# Patient Record
Sex: Male | Born: 1940 | Race: White | Hispanic: No | Marital: Married | Smoking: Never smoker
Health system: Southern US, Community
[De-identification: ages and names within clinical notes are randomized; demographics above are authoritative.]

## PROBLEM LIST (undated history)

## (undated) DIAGNOSIS — I499 Cardiac arrhythmia, unspecified: Secondary | ICD-10-CM

## (undated) DIAGNOSIS — T884XXA Failed or difficult intubation, initial encounter: Secondary | ICD-10-CM

## (undated) DIAGNOSIS — G971 Other reaction to spinal and lumbar puncture: Secondary | ICD-10-CM

## (undated) DIAGNOSIS — C801 Malignant (primary) neoplasm, unspecified: Secondary | ICD-10-CM

## (undated) DIAGNOSIS — E7800 Pure hypercholesterolemia, unspecified: Secondary | ICD-10-CM

## (undated) DIAGNOSIS — I639 Cerebral infarction, unspecified: Secondary | ICD-10-CM

## (undated) DIAGNOSIS — E119 Type 2 diabetes mellitus without complications: Secondary | ICD-10-CM

## (undated) DIAGNOSIS — I2510 Atherosclerotic heart disease of native coronary artery without angina pectoris: Secondary | ICD-10-CM

## (undated) DIAGNOSIS — Z87442 Personal history of urinary calculi: Secondary | ICD-10-CM

## (undated) DIAGNOSIS — I4891 Unspecified atrial fibrillation: Secondary | ICD-10-CM

## (undated) DIAGNOSIS — K759 Inflammatory liver disease, unspecified: Secondary | ICD-10-CM

## (undated) DIAGNOSIS — N289 Disorder of kidney and ureter, unspecified: Secondary | ICD-10-CM

## (undated) DIAGNOSIS — I10 Essential (primary) hypertension: Secondary | ICD-10-CM

## (undated) DIAGNOSIS — M1990 Unspecified osteoarthritis, unspecified site: Secondary | ICD-10-CM

## (undated) DIAGNOSIS — R0600 Dyspnea, unspecified: Secondary | ICD-10-CM

## (undated) HISTORY — PX: CHOLECYSTECTOMY: SHX55

## (undated) HISTORY — PX: HERNIA REPAIR: SHX51

## (undated) HISTORY — PX: OTHER SURGICAL HISTORY: SHX169

## (undated) HISTORY — PX: TOTAL KNEE ARTHROPLASTY: SHX125

## (undated) HISTORY — PX: CIRCUMCISION: SUR203

## (undated) HISTORY — PX: ACHILLES TENDON REPAIR: SUR1153

## (undated) HISTORY — PX: LITHOTRIPSY: SUR834

## (undated) HISTORY — PX: CLAVICLE SURGERY: SHX598

---

## 2011-09-03 DIAGNOSIS — E7800 Pure hypercholesterolemia, unspecified: Secondary | ICD-10-CM | POA: Diagnosis not present

## 2011-09-03 DIAGNOSIS — E119 Type 2 diabetes mellitus without complications: Secondary | ICD-10-CM | POA: Diagnosis not present

## 2011-09-03 DIAGNOSIS — I10 Essential (primary) hypertension: Secondary | ICD-10-CM | POA: Diagnosis not present

## 2011-09-09 DIAGNOSIS — E669 Obesity, unspecified: Secondary | ICD-10-CM | POA: Diagnosis not present

## 2011-09-09 DIAGNOSIS — I10 Essential (primary) hypertension: Secondary | ICD-10-CM | POA: Diagnosis not present

## 2011-09-09 DIAGNOSIS — E7800 Pure hypercholesterolemia, unspecified: Secondary | ICD-10-CM | POA: Diagnosis not present

## 2011-09-09 DIAGNOSIS — M1990 Unspecified osteoarthritis, unspecified site: Secondary | ICD-10-CM | POA: Diagnosis not present

## 2011-09-09 DIAGNOSIS — N400 Enlarged prostate without lower urinary tract symptoms: Secondary | ICD-10-CM | POA: Diagnosis not present

## 2011-09-18 DIAGNOSIS — M1710 Unilateral primary osteoarthritis, unspecified knee: Secondary | ICD-10-CM | POA: Diagnosis not present

## 2011-10-01 DIAGNOSIS — M1710 Unilateral primary osteoarthritis, unspecified knee: Secondary | ICD-10-CM | POA: Diagnosis not present

## 2011-10-06 DIAGNOSIS — M1710 Unilateral primary osteoarthritis, unspecified knee: Secondary | ICD-10-CM | POA: Diagnosis not present

## 2011-10-12 DIAGNOSIS — M1710 Unilateral primary osteoarthritis, unspecified knee: Secondary | ICD-10-CM | POA: Diagnosis not present

## 2011-12-01 DIAGNOSIS — H538 Other visual disturbances: Secondary | ICD-10-CM | POA: Diagnosis not present

## 2011-12-01 DIAGNOSIS — E119 Type 2 diabetes mellitus without complications: Secondary | ICD-10-CM | POA: Diagnosis not present

## 2011-12-01 DIAGNOSIS — H2510 Age-related nuclear cataract, unspecified eye: Secondary | ICD-10-CM | POA: Diagnosis not present

## 2011-12-22 DIAGNOSIS — Z87442 Personal history of urinary calculi: Secondary | ICD-10-CM | POA: Diagnosis not present

## 2011-12-22 DIAGNOSIS — I10 Essential (primary) hypertension: Secondary | ICD-10-CM | POA: Diagnosis not present

## 2011-12-22 DIAGNOSIS — E119 Type 2 diabetes mellitus without complications: Secondary | ICD-10-CM | POA: Diagnosis not present

## 2011-12-22 DIAGNOSIS — N1330 Unspecified hydronephrosis: Secondary | ICD-10-CM | POA: Diagnosis not present

## 2011-12-22 DIAGNOSIS — N200 Calculus of kidney: Secondary | ICD-10-CM | POA: Diagnosis not present

## 2011-12-22 DIAGNOSIS — N2889 Other specified disorders of kidney and ureter: Secondary | ICD-10-CM | POA: Diagnosis not present

## 2011-12-25 DIAGNOSIS — N200 Calculus of kidney: Secondary | ICD-10-CM | POA: Diagnosis not present

## 2011-12-25 DIAGNOSIS — N400 Enlarged prostate without lower urinary tract symptoms: Secondary | ICD-10-CM | POA: Diagnosis not present

## 2012-01-04 DIAGNOSIS — IMO0001 Reserved for inherently not codable concepts without codable children: Secondary | ICD-10-CM | POA: Diagnosis not present

## 2012-01-04 DIAGNOSIS — E7800 Pure hypercholesterolemia, unspecified: Secondary | ICD-10-CM | POA: Diagnosis not present

## 2012-01-04 DIAGNOSIS — I10 Essential (primary) hypertension: Secondary | ICD-10-CM | POA: Diagnosis not present

## 2012-01-04 DIAGNOSIS — N400 Enlarged prostate without lower urinary tract symptoms: Secondary | ICD-10-CM | POA: Diagnosis not present

## 2012-01-11 DIAGNOSIS — E669 Obesity, unspecified: Secondary | ICD-10-CM | POA: Diagnosis not present

## 2012-01-11 DIAGNOSIS — E7800 Pure hypercholesterolemia, unspecified: Secondary | ICD-10-CM | POA: Diagnosis not present

## 2012-01-11 DIAGNOSIS — N400 Enlarged prostate without lower urinary tract symptoms: Secondary | ICD-10-CM | POA: Diagnosis not present

## 2012-01-11 DIAGNOSIS — M1990 Unspecified osteoarthritis, unspecified site: Secondary | ICD-10-CM | POA: Diagnosis not present

## 2012-01-11 DIAGNOSIS — I10 Essential (primary) hypertension: Secondary | ICD-10-CM | POA: Diagnosis not present

## 2012-01-13 DIAGNOSIS — N200 Calculus of kidney: Secondary | ICD-10-CM | POA: Diagnosis not present

## 2012-01-21 DIAGNOSIS — I4891 Unspecified atrial fibrillation: Secondary | ICD-10-CM | POA: Diagnosis not present

## 2012-01-21 DIAGNOSIS — N390 Urinary tract infection, site not specified: Secondary | ICD-10-CM | POA: Diagnosis not present

## 2012-01-21 DIAGNOSIS — M79609 Pain in unspecified limb: Secondary | ICD-10-CM | POA: Diagnosis not present

## 2012-01-21 DIAGNOSIS — Z7982 Long term (current) use of aspirin: Secondary | ICD-10-CM | POA: Diagnosis not present

## 2012-01-21 DIAGNOSIS — R9431 Abnormal electrocardiogram [ECG] [EKG]: Secondary | ICD-10-CM | POA: Diagnosis not present

## 2012-01-21 DIAGNOSIS — Z6841 Body Mass Index (BMI) 40.0 and over, adult: Secondary | ICD-10-CM | POA: Diagnosis not present

## 2012-01-21 DIAGNOSIS — N201 Calculus of ureter: Secondary | ICD-10-CM | POA: Diagnosis not present

## 2012-01-21 DIAGNOSIS — Z87891 Personal history of nicotine dependence: Secondary | ICD-10-CM | POA: Diagnosis not present

## 2012-01-21 DIAGNOSIS — N200 Calculus of kidney: Secondary | ICD-10-CM | POA: Diagnosis not present

## 2012-01-21 DIAGNOSIS — Z9089 Acquired absence of other organs: Secondary | ICD-10-CM | POA: Diagnosis not present

## 2012-01-21 DIAGNOSIS — R112 Nausea with vomiting, unspecified: Secondary | ICD-10-CM | POA: Diagnosis not present

## 2012-01-21 DIAGNOSIS — Z853 Personal history of malignant neoplasm of breast: Secondary | ICD-10-CM | POA: Diagnosis not present

## 2012-01-21 DIAGNOSIS — E119 Type 2 diabetes mellitus without complications: Secondary | ICD-10-CM | POA: Diagnosis not present

## 2012-01-21 DIAGNOSIS — N400 Enlarged prostate without lower urinary tract symptoms: Secondary | ICD-10-CM | POA: Diagnosis not present

## 2012-01-21 DIAGNOSIS — R000 Tachycardia, unspecified: Secondary | ICD-10-CM | POA: Diagnosis not present

## 2012-01-21 DIAGNOSIS — N401 Enlarged prostate with lower urinary tract symptoms: Secondary | ICD-10-CM | POA: Diagnosis not present

## 2012-01-21 DIAGNOSIS — N179 Acute kidney failure, unspecified: Secondary | ICD-10-CM | POA: Diagnosis not present

## 2012-01-21 DIAGNOSIS — R079 Chest pain, unspecified: Secondary | ICD-10-CM | POA: Diagnosis not present

## 2012-01-21 DIAGNOSIS — Z9889 Other specified postprocedural states: Secondary | ICD-10-CM | POA: Diagnosis not present

## 2012-01-21 DIAGNOSIS — Z79899 Other long term (current) drug therapy: Secondary | ICD-10-CM | POA: Diagnosis not present

## 2012-01-21 DIAGNOSIS — M25519 Pain in unspecified shoulder: Secondary | ICD-10-CM | POA: Diagnosis not present

## 2012-01-22 DIAGNOSIS — N401 Enlarged prostate with lower urinary tract symptoms: Secondary | ICD-10-CM | POA: Diagnosis present

## 2012-01-22 DIAGNOSIS — G609 Hereditary and idiopathic neuropathy, unspecified: Secondary | ICD-10-CM | POA: Diagnosis present

## 2012-01-22 DIAGNOSIS — E871 Hypo-osmolality and hyponatremia: Secondary | ICD-10-CM | POA: Diagnosis present

## 2012-01-22 DIAGNOSIS — M75120 Complete rotator cuff tear or rupture of unspecified shoulder, not specified as traumatic: Secondary | ICD-10-CM | POA: Diagnosis not present

## 2012-01-22 DIAGNOSIS — N390 Urinary tract infection, site not specified: Secondary | ICD-10-CM | POA: Diagnosis not present

## 2012-01-22 DIAGNOSIS — R578 Other shock: Secondary | ICD-10-CM | POA: Diagnosis present

## 2012-01-22 DIAGNOSIS — S43429A Sprain of unspecified rotator cuff capsule, initial encounter: Secondary | ICD-10-CM | POA: Diagnosis present

## 2012-01-22 DIAGNOSIS — D696 Thrombocytopenia, unspecified: Secondary | ICD-10-CM | POA: Diagnosis present

## 2012-01-22 DIAGNOSIS — N200 Calculus of kidney: Secondary | ICD-10-CM | POA: Diagnosis present

## 2012-01-22 DIAGNOSIS — M25519 Pain in unspecified shoulder: Secondary | ICD-10-CM | POA: Diagnosis not present

## 2012-01-22 DIAGNOSIS — N179 Acute kidney failure, unspecified: Secondary | ICD-10-CM | POA: Diagnosis not present

## 2012-01-22 DIAGNOSIS — E119 Type 2 diabetes mellitus without complications: Secondary | ICD-10-CM | POA: Diagnosis present

## 2012-01-22 DIAGNOSIS — N189 Chronic kidney disease, unspecified: Secondary | ICD-10-CM | POA: Diagnosis present

## 2012-01-22 DIAGNOSIS — D649 Anemia, unspecified: Secondary | ICD-10-CM | POA: Diagnosis present

## 2012-01-22 DIAGNOSIS — I129 Hypertensive chronic kidney disease with stage 1 through stage 4 chronic kidney disease, or unspecified chronic kidney disease: Secondary | ICD-10-CM | POA: Diagnosis present

## 2012-01-22 DIAGNOSIS — I4891 Unspecified atrial fibrillation: Secondary | ICD-10-CM | POA: Diagnosis present

## 2012-02-11 DIAGNOSIS — R0602 Shortness of breath: Secondary | ICD-10-CM | POA: Diagnosis not present

## 2012-02-11 DIAGNOSIS — I4891 Unspecified atrial fibrillation: Secondary | ICD-10-CM | POA: Diagnosis not present

## 2012-02-11 DIAGNOSIS — N200 Calculus of kidney: Secondary | ICD-10-CM | POA: Diagnosis not present

## 2012-02-29 DIAGNOSIS — Z8619 Personal history of other infectious and parasitic diseases: Secondary | ICD-10-CM | POA: Diagnosis not present

## 2012-02-29 DIAGNOSIS — N201 Calculus of ureter: Secondary | ICD-10-CM | POA: Diagnosis not present

## 2012-02-29 DIAGNOSIS — E119 Type 2 diabetes mellitus without complications: Secondary | ICD-10-CM | POA: Diagnosis not present

## 2012-02-29 DIAGNOSIS — N209 Urinary calculus, unspecified: Secondary | ICD-10-CM | POA: Diagnosis not present

## 2012-03-08 DIAGNOSIS — M25519 Pain in unspecified shoulder: Secondary | ICD-10-CM | POA: Diagnosis not present

## 2012-03-16 DIAGNOSIS — N200 Calculus of kidney: Secondary | ICD-10-CM | POA: Diagnosis not present

## 2012-04-15 DIAGNOSIS — N200 Calculus of kidney: Secondary | ICD-10-CM | POA: Diagnosis not present

## 2012-04-29 DIAGNOSIS — M1710 Unilateral primary osteoarthritis, unspecified knee: Secondary | ICD-10-CM | POA: Diagnosis not present

## 2012-05-02 DIAGNOSIS — E782 Mixed hyperlipidemia: Secondary | ICD-10-CM | POA: Diagnosis not present

## 2012-05-02 DIAGNOSIS — I10 Essential (primary) hypertension: Secondary | ICD-10-CM | POA: Diagnosis not present

## 2012-05-05 DIAGNOSIS — M1710 Unilateral primary osteoarthritis, unspecified knee: Secondary | ICD-10-CM | POA: Diagnosis not present

## 2012-05-09 DIAGNOSIS — Z23 Encounter for immunization: Secondary | ICD-10-CM | POA: Diagnosis not present

## 2012-05-09 DIAGNOSIS — N400 Enlarged prostate without lower urinary tract symptoms: Secondary | ICD-10-CM | POA: Diagnosis not present

## 2012-05-09 DIAGNOSIS — M1990 Unspecified osteoarthritis, unspecified site: Secondary | ICD-10-CM | POA: Diagnosis not present

## 2012-05-09 DIAGNOSIS — E669 Obesity, unspecified: Secondary | ICD-10-CM | POA: Diagnosis not present

## 2012-05-09 DIAGNOSIS — E7800 Pure hypercholesterolemia, unspecified: Secondary | ICD-10-CM | POA: Diagnosis not present

## 2012-05-09 DIAGNOSIS — I4891 Unspecified atrial fibrillation: Secondary | ICD-10-CM | POA: Diagnosis not present

## 2012-05-09 DIAGNOSIS — E119 Type 2 diabetes mellitus without complications: Secondary | ICD-10-CM | POA: Diagnosis not present

## 2012-05-09 DIAGNOSIS — I10 Essential (primary) hypertension: Secondary | ICD-10-CM | POA: Diagnosis not present

## 2012-05-12 DIAGNOSIS — N200 Calculus of kidney: Secondary | ICD-10-CM | POA: Diagnosis not present

## 2012-05-12 DIAGNOSIS — R0602 Shortness of breath: Secondary | ICD-10-CM | POA: Diagnosis not present

## 2012-05-12 DIAGNOSIS — I4891 Unspecified atrial fibrillation: Secondary | ICD-10-CM | POA: Diagnosis not present

## 2012-05-13 DIAGNOSIS — M1710 Unilateral primary osteoarthritis, unspecified knee: Secondary | ICD-10-CM | POA: Diagnosis not present

## 2012-05-19 DIAGNOSIS — I4891 Unspecified atrial fibrillation: Secondary | ICD-10-CM | POA: Diagnosis not present

## 2012-09-06 DIAGNOSIS — I10 Essential (primary) hypertension: Secondary | ICD-10-CM | POA: Diagnosis not present

## 2012-09-06 DIAGNOSIS — E7800 Pure hypercholesterolemia, unspecified: Secondary | ICD-10-CM | POA: Diagnosis not present

## 2012-09-06 DIAGNOSIS — E119 Type 2 diabetes mellitus without complications: Secondary | ICD-10-CM | POA: Diagnosis not present

## 2012-09-29 DIAGNOSIS — I10 Essential (primary) hypertension: Secondary | ICD-10-CM | POA: Diagnosis not present

## 2012-09-29 DIAGNOSIS — N400 Enlarged prostate without lower urinary tract symptoms: Secondary | ICD-10-CM | POA: Diagnosis not present

## 2012-09-29 DIAGNOSIS — E669 Obesity, unspecified: Secondary | ICD-10-CM | POA: Diagnosis not present

## 2012-09-29 DIAGNOSIS — E7800 Pure hypercholesterolemia, unspecified: Secondary | ICD-10-CM | POA: Diagnosis not present

## 2012-09-29 DIAGNOSIS — I4891 Unspecified atrial fibrillation: Secondary | ICD-10-CM | POA: Diagnosis not present

## 2012-09-29 DIAGNOSIS — M1990 Unspecified osteoarthritis, unspecified site: Secondary | ICD-10-CM | POA: Diagnosis not present

## 2012-10-14 DIAGNOSIS — R351 Nocturia: Secondary | ICD-10-CM | POA: Diagnosis not present

## 2012-10-14 DIAGNOSIS — N200 Calculus of kidney: Secondary | ICD-10-CM | POA: Diagnosis not present

## 2012-11-15 DIAGNOSIS — M1710 Unilateral primary osteoarthritis, unspecified knee: Secondary | ICD-10-CM | POA: Diagnosis not present

## 2012-11-22 DIAGNOSIS — M1710 Unilateral primary osteoarthritis, unspecified knee: Secondary | ICD-10-CM | POA: Diagnosis not present

## 2012-11-28 DIAGNOSIS — M1710 Unilateral primary osteoarthritis, unspecified knee: Secondary | ICD-10-CM | POA: Diagnosis not present

## 2013-01-17 DIAGNOSIS — I10 Essential (primary) hypertension: Secondary | ICD-10-CM | POA: Diagnosis not present

## 2013-01-17 DIAGNOSIS — E119 Type 2 diabetes mellitus without complications: Secondary | ICD-10-CM | POA: Diagnosis not present

## 2013-01-17 DIAGNOSIS — E7800 Pure hypercholesterolemia, unspecified: Secondary | ICD-10-CM | POA: Diagnosis not present

## 2013-01-30 DIAGNOSIS — E669 Obesity, unspecified: Secondary | ICD-10-CM | POA: Diagnosis not present

## 2013-01-30 DIAGNOSIS — I4891 Unspecified atrial fibrillation: Secondary | ICD-10-CM | POA: Diagnosis not present

## 2013-01-30 DIAGNOSIS — N400 Enlarged prostate without lower urinary tract symptoms: Secondary | ICD-10-CM | POA: Diagnosis not present

## 2013-01-30 DIAGNOSIS — E7800 Pure hypercholesterolemia, unspecified: Secondary | ICD-10-CM | POA: Diagnosis not present

## 2013-01-30 DIAGNOSIS — I10 Essential (primary) hypertension: Secondary | ICD-10-CM | POA: Diagnosis not present

## 2013-01-30 DIAGNOSIS — M1990 Unspecified osteoarthritis, unspecified site: Secondary | ICD-10-CM | POA: Diagnosis not present

## 2013-03-02 DIAGNOSIS — D126 Benign neoplasm of colon, unspecified: Secondary | ICD-10-CM | POA: Diagnosis not present

## 2013-03-02 DIAGNOSIS — Z800 Family history of malignant neoplasm of digestive organs: Secondary | ICD-10-CM | POA: Diagnosis not present

## 2013-04-07 DIAGNOSIS — Z79899 Other long term (current) drug therapy: Secondary | ICD-10-CM | POA: Diagnosis not present

## 2013-04-07 DIAGNOSIS — E785 Hyperlipidemia, unspecified: Secondary | ICD-10-CM | POA: Diagnosis not present

## 2013-04-07 DIAGNOSIS — E119 Type 2 diabetes mellitus without complications: Secondary | ICD-10-CM | POA: Diagnosis not present

## 2013-04-07 DIAGNOSIS — Z800 Family history of malignant neoplasm of digestive organs: Secondary | ICD-10-CM | POA: Diagnosis not present

## 2013-04-07 DIAGNOSIS — D126 Benign neoplasm of colon, unspecified: Secondary | ICD-10-CM | POA: Diagnosis not present

## 2013-04-07 DIAGNOSIS — I10 Essential (primary) hypertension: Secondary | ICD-10-CM | POA: Diagnosis not present

## 2013-04-07 DIAGNOSIS — K648 Other hemorrhoids: Secondary | ICD-10-CM | POA: Diagnosis not present

## 2013-04-07 DIAGNOSIS — Z1211 Encounter for screening for malignant neoplasm of colon: Secondary | ICD-10-CM | POA: Diagnosis not present

## 2013-04-07 DIAGNOSIS — Z91013 Allergy to seafood: Secondary | ICD-10-CM | POA: Diagnosis not present

## 2013-04-07 DIAGNOSIS — Z7982 Long term (current) use of aspirin: Secondary | ICD-10-CM | POA: Diagnosis not present

## 2013-04-07 DIAGNOSIS — D128 Benign neoplasm of rectum: Secondary | ICD-10-CM | POA: Diagnosis not present

## 2013-04-07 DIAGNOSIS — D371 Neoplasm of uncertain behavior of stomach: Secondary | ICD-10-CM | POA: Diagnosis not present

## 2013-04-07 DIAGNOSIS — Z86010 Personal history of colonic polyps: Secondary | ICD-10-CM | POA: Diagnosis not present

## 2013-04-18 DIAGNOSIS — N476 Balanoposthitis: Secondary | ICD-10-CM | POA: Diagnosis not present

## 2013-04-18 DIAGNOSIS — N200 Calculus of kidney: Secondary | ICD-10-CM | POA: Diagnosis not present

## 2013-04-20 DIAGNOSIS — Z800 Family history of malignant neoplasm of digestive organs: Secondary | ICD-10-CM | POA: Diagnosis not present

## 2013-04-20 DIAGNOSIS — K624 Stenosis of anus and rectum: Secondary | ICD-10-CM | POA: Diagnosis not present

## 2013-04-20 DIAGNOSIS — D126 Benign neoplasm of colon, unspecified: Secondary | ICD-10-CM | POA: Diagnosis not present

## 2013-05-25 DIAGNOSIS — I10 Essential (primary) hypertension: Secondary | ICD-10-CM | POA: Diagnosis not present

## 2013-05-25 DIAGNOSIS — E119 Type 2 diabetes mellitus without complications: Secondary | ICD-10-CM | POA: Diagnosis not present

## 2013-05-25 DIAGNOSIS — E669 Obesity, unspecified: Secondary | ICD-10-CM | POA: Diagnosis not present

## 2013-05-25 DIAGNOSIS — E7800 Pure hypercholesterolemia, unspecified: Secondary | ICD-10-CM | POA: Diagnosis not present

## 2013-05-25 DIAGNOSIS — I4891 Unspecified atrial fibrillation: Secondary | ICD-10-CM | POA: Diagnosis not present

## 2013-06-05 DIAGNOSIS — E669 Obesity, unspecified: Secondary | ICD-10-CM | POA: Diagnosis not present

## 2013-06-05 DIAGNOSIS — M1990 Unspecified osteoarthritis, unspecified site: Secondary | ICD-10-CM | POA: Diagnosis not present

## 2013-06-05 DIAGNOSIS — E7800 Pure hypercholesterolemia, unspecified: Secondary | ICD-10-CM | POA: Diagnosis not present

## 2013-06-05 DIAGNOSIS — I10 Essential (primary) hypertension: Secondary | ICD-10-CM | POA: Diagnosis not present

## 2013-06-05 DIAGNOSIS — I4891 Unspecified atrial fibrillation: Secondary | ICD-10-CM | POA: Diagnosis not present

## 2013-06-05 DIAGNOSIS — N400 Enlarged prostate without lower urinary tract symptoms: Secondary | ICD-10-CM | POA: Diagnosis not present

## 2013-06-05 DIAGNOSIS — Z23 Encounter for immunization: Secondary | ICD-10-CM | POA: Diagnosis not present

## 2013-09-13 DIAGNOSIS — IMO0002 Reserved for concepts with insufficient information to code with codable children: Secondary | ICD-10-CM | POA: Diagnosis not present

## 2013-09-13 DIAGNOSIS — M1710 Unilateral primary osteoarthritis, unspecified knee: Secondary | ICD-10-CM | POA: Diagnosis not present

## 2013-09-13 DIAGNOSIS — M25569 Pain in unspecified knee: Secondary | ICD-10-CM | POA: Diagnosis not present

## 2013-09-13 DIAGNOSIS — M25469 Effusion, unspecified knee: Secondary | ICD-10-CM | POA: Diagnosis not present

## 2013-09-13 DIAGNOSIS — M2340 Loose body in knee, unspecified knee: Secondary | ICD-10-CM | POA: Diagnosis not present

## 2013-09-27 DIAGNOSIS — E119 Type 2 diabetes mellitus without complications: Secondary | ICD-10-CM | POA: Diagnosis not present

## 2013-09-27 DIAGNOSIS — I10 Essential (primary) hypertension: Secondary | ICD-10-CM | POA: Diagnosis not present

## 2013-09-27 DIAGNOSIS — I4891 Unspecified atrial fibrillation: Secondary | ICD-10-CM | POA: Diagnosis not present

## 2013-09-27 DIAGNOSIS — E7800 Pure hypercholesterolemia, unspecified: Secondary | ICD-10-CM | POA: Diagnosis not present

## 2013-10-04 DIAGNOSIS — I4891 Unspecified atrial fibrillation: Secondary | ICD-10-CM | POA: Diagnosis not present

## 2013-10-04 DIAGNOSIS — N400 Enlarged prostate without lower urinary tract symptoms: Secondary | ICD-10-CM | POA: Diagnosis not present

## 2013-10-04 DIAGNOSIS — M1990 Unspecified osteoarthritis, unspecified site: Secondary | ICD-10-CM | POA: Diagnosis not present

## 2013-10-04 DIAGNOSIS — E669 Obesity, unspecified: Secondary | ICD-10-CM | POA: Diagnosis not present

## 2013-10-04 DIAGNOSIS — E7800 Pure hypercholesterolemia, unspecified: Secondary | ICD-10-CM | POA: Diagnosis not present

## 2013-10-04 DIAGNOSIS — IMO0001 Reserved for inherently not codable concepts without codable children: Secondary | ICD-10-CM | POA: Diagnosis not present

## 2013-10-04 DIAGNOSIS — I10 Essential (primary) hypertension: Secondary | ICD-10-CM | POA: Diagnosis not present

## 2013-10-24 DIAGNOSIS — M1710 Unilateral primary osteoarthritis, unspecified knee: Secondary | ICD-10-CM | POA: Diagnosis not present

## 2013-10-26 DIAGNOSIS — N200 Calculus of kidney: Secondary | ICD-10-CM | POA: Diagnosis not present

## 2013-10-26 DIAGNOSIS — N401 Enlarged prostate with lower urinary tract symptoms: Secondary | ICD-10-CM | POA: Diagnosis not present

## 2013-10-26 DIAGNOSIS — N476 Balanoposthitis: Secondary | ICD-10-CM | POA: Diagnosis not present

## 2013-10-31 DIAGNOSIS — M1710 Unilateral primary osteoarthritis, unspecified knee: Secondary | ICD-10-CM | POA: Diagnosis not present

## 2013-11-07 DIAGNOSIS — M1710 Unilateral primary osteoarthritis, unspecified knee: Secondary | ICD-10-CM | POA: Diagnosis not present

## 2014-02-07 DIAGNOSIS — I10 Essential (primary) hypertension: Secondary | ICD-10-CM | POA: Diagnosis not present

## 2014-02-07 DIAGNOSIS — E119 Type 2 diabetes mellitus without complications: Secondary | ICD-10-CM | POA: Diagnosis not present

## 2014-02-07 DIAGNOSIS — E7800 Pure hypercholesterolemia, unspecified: Secondary | ICD-10-CM | POA: Diagnosis not present

## 2014-02-07 DIAGNOSIS — E669 Obesity, unspecified: Secondary | ICD-10-CM | POA: Diagnosis not present

## 2014-02-14 DIAGNOSIS — Z23 Encounter for immunization: Secondary | ICD-10-CM | POA: Diagnosis not present

## 2014-02-14 DIAGNOSIS — I4891 Unspecified atrial fibrillation: Secondary | ICD-10-CM | POA: Diagnosis not present

## 2014-02-14 DIAGNOSIS — I10 Essential (primary) hypertension: Secondary | ICD-10-CM | POA: Diagnosis not present

## 2014-02-14 DIAGNOSIS — N400 Enlarged prostate without lower urinary tract symptoms: Secondary | ICD-10-CM | POA: Diagnosis not present

## 2014-02-14 DIAGNOSIS — M1990 Unspecified osteoarthritis, unspecified site: Secondary | ICD-10-CM | POA: Diagnosis not present

## 2014-02-14 DIAGNOSIS — IMO0001 Reserved for inherently not codable concepts without codable children: Secondary | ICD-10-CM | POA: Diagnosis not present

## 2014-02-14 DIAGNOSIS — E669 Obesity, unspecified: Secondary | ICD-10-CM | POA: Diagnosis not present

## 2014-02-14 DIAGNOSIS — E7800 Pure hypercholesterolemia, unspecified: Secondary | ICD-10-CM | POA: Diagnosis not present

## 2014-06-07 DIAGNOSIS — E119 Type 2 diabetes mellitus without complications: Secondary | ICD-10-CM | POA: Diagnosis not present

## 2014-06-07 DIAGNOSIS — I482 Chronic atrial fibrillation: Secondary | ICD-10-CM | POA: Diagnosis not present

## 2014-06-07 DIAGNOSIS — I10 Essential (primary) hypertension: Secondary | ICD-10-CM | POA: Diagnosis not present

## 2014-06-07 DIAGNOSIS — E780 Pure hypercholesterolemia: Secondary | ICD-10-CM | POA: Diagnosis not present

## 2014-06-14 DIAGNOSIS — I10 Essential (primary) hypertension: Secondary | ICD-10-CM | POA: Diagnosis not present

## 2014-06-14 DIAGNOSIS — I482 Chronic atrial fibrillation: Secondary | ICD-10-CM | POA: Diagnosis not present

## 2014-06-14 DIAGNOSIS — Z23 Encounter for immunization: Secondary | ICD-10-CM | POA: Diagnosis not present

## 2014-06-14 DIAGNOSIS — E1365 Other specified diabetes mellitus with hyperglycemia: Secondary | ICD-10-CM | POA: Diagnosis not present

## 2014-06-14 DIAGNOSIS — E782 Mixed hyperlipidemia: Secondary | ICD-10-CM | POA: Diagnosis not present

## 2014-08-13 DIAGNOSIS — L609 Nail disorder, unspecified: Secondary | ICD-10-CM | POA: Diagnosis not present

## 2014-08-13 DIAGNOSIS — L110 Acquired keratosis follicularis: Secondary | ICD-10-CM | POA: Diagnosis not present

## 2014-08-13 DIAGNOSIS — E1140 Type 2 diabetes mellitus with diabetic neuropathy, unspecified: Secondary | ICD-10-CM | POA: Diagnosis not present

## 2014-08-28 DIAGNOSIS — M170 Bilateral primary osteoarthritis of knee: Secondary | ICD-10-CM | POA: Diagnosis not present

## 2014-08-29 DIAGNOSIS — H538 Other visual disturbances: Secondary | ICD-10-CM | POA: Diagnosis not present

## 2014-08-29 DIAGNOSIS — E119 Type 2 diabetes mellitus without complications: Secondary | ICD-10-CM | POA: Diagnosis not present

## 2014-08-29 DIAGNOSIS — H2513 Age-related nuclear cataract, bilateral: Secondary | ICD-10-CM | POA: Diagnosis not present

## 2014-09-04 DIAGNOSIS — M170 Bilateral primary osteoarthritis of knee: Secondary | ICD-10-CM | POA: Diagnosis not present

## 2014-09-11 DIAGNOSIS — M170 Bilateral primary osteoarthritis of knee: Secondary | ICD-10-CM | POA: Diagnosis not present

## 2014-10-02 DIAGNOSIS — E1365 Other specified diabetes mellitus with hyperglycemia: Secondary | ICD-10-CM | POA: Diagnosis not present

## 2014-10-02 DIAGNOSIS — E780 Pure hypercholesterolemia: Secondary | ICD-10-CM | POA: Diagnosis not present

## 2014-10-02 DIAGNOSIS — I10 Essential (primary) hypertension: Secondary | ICD-10-CM | POA: Diagnosis not present

## 2014-10-09 DIAGNOSIS — E1365 Other specified diabetes mellitus with hyperglycemia: Secondary | ICD-10-CM | POA: Diagnosis not present

## 2014-10-09 DIAGNOSIS — I10 Essential (primary) hypertension: Secondary | ICD-10-CM | POA: Diagnosis not present

## 2014-10-09 DIAGNOSIS — Z1389 Encounter for screening for other disorder: Secondary | ICD-10-CM | POA: Diagnosis not present

## 2014-10-09 DIAGNOSIS — I480 Paroxysmal atrial fibrillation: Secondary | ICD-10-CM | POA: Diagnosis not present

## 2014-10-09 DIAGNOSIS — E782 Mixed hyperlipidemia: Secondary | ICD-10-CM | POA: Diagnosis not present

## 2014-10-22 DIAGNOSIS — E1151 Type 2 diabetes mellitus with diabetic peripheral angiopathy without gangrene: Secondary | ICD-10-CM | POA: Diagnosis not present

## 2014-10-22 DIAGNOSIS — E1140 Type 2 diabetes mellitus with diabetic neuropathy, unspecified: Secondary | ICD-10-CM | POA: Diagnosis not present

## 2014-11-15 DIAGNOSIS — N2889 Other specified disorders of kidney and ureter: Secondary | ICD-10-CM | POA: Diagnosis not present

## 2014-11-15 DIAGNOSIS — N200 Calculus of kidney: Secondary | ICD-10-CM | POA: Diagnosis not present

## 2014-11-15 DIAGNOSIS — N401 Enlarged prostate with lower urinary tract symptoms: Secondary | ICD-10-CM | POA: Diagnosis not present

## 2015-01-01 DIAGNOSIS — E1140 Type 2 diabetes mellitus with diabetic neuropathy, unspecified: Secondary | ICD-10-CM | POA: Diagnosis not present

## 2015-01-01 DIAGNOSIS — E1151 Type 2 diabetes mellitus with diabetic peripheral angiopathy without gangrene: Secondary | ICD-10-CM | POA: Diagnosis not present

## 2015-02-05 DIAGNOSIS — E1365 Other specified diabetes mellitus with hyperglycemia: Secondary | ICD-10-CM | POA: Diagnosis not present

## 2015-02-05 DIAGNOSIS — E782 Mixed hyperlipidemia: Secondary | ICD-10-CM | POA: Diagnosis not present

## 2015-02-05 DIAGNOSIS — E780 Pure hypercholesterolemia: Secondary | ICD-10-CM | POA: Diagnosis not present

## 2015-02-05 DIAGNOSIS — I10 Essential (primary) hypertension: Secondary | ICD-10-CM | POA: Diagnosis not present

## 2015-02-05 DIAGNOSIS — I482 Chronic atrial fibrillation: Secondary | ICD-10-CM | POA: Diagnosis not present

## 2015-02-11 DIAGNOSIS — I480 Paroxysmal atrial fibrillation: Secondary | ICD-10-CM | POA: Diagnosis not present

## 2015-02-11 DIAGNOSIS — E1365 Other specified diabetes mellitus with hyperglycemia: Secondary | ICD-10-CM | POA: Diagnosis not present

## 2015-02-11 DIAGNOSIS — I10 Essential (primary) hypertension: Secondary | ICD-10-CM | POA: Diagnosis not present

## 2015-02-11 DIAGNOSIS — N200 Calculus of kidney: Secondary | ICD-10-CM | POA: Diagnosis not present

## 2015-02-11 DIAGNOSIS — E782 Mixed hyperlipidemia: Secondary | ICD-10-CM | POA: Diagnosis not present

## 2015-02-12 DIAGNOSIS — I10 Essential (primary) hypertension: Secondary | ICD-10-CM | POA: Diagnosis not present

## 2015-03-15 DIAGNOSIS — I10 Essential (primary) hypertension: Secondary | ICD-10-CM | POA: Diagnosis not present

## 2015-03-20 DIAGNOSIS — E1140 Type 2 diabetes mellitus with diabetic neuropathy, unspecified: Secondary | ICD-10-CM | POA: Diagnosis not present

## 2015-03-20 DIAGNOSIS — E1151 Type 2 diabetes mellitus with diabetic peripheral angiopathy without gangrene: Secondary | ICD-10-CM | POA: Diagnosis not present

## 2015-04-15 DIAGNOSIS — I10 Essential (primary) hypertension: Secondary | ICD-10-CM | POA: Diagnosis not present

## 2015-05-15 DIAGNOSIS — I10 Essential (primary) hypertension: Secondary | ICD-10-CM | POA: Diagnosis not present

## 2015-05-28 DIAGNOSIS — E1151 Type 2 diabetes mellitus with diabetic peripheral angiopathy without gangrene: Secondary | ICD-10-CM | POA: Diagnosis not present

## 2015-05-28 DIAGNOSIS — E1140 Type 2 diabetes mellitus with diabetic neuropathy, unspecified: Secondary | ICD-10-CM | POA: Diagnosis not present

## 2015-06-10 DIAGNOSIS — E7800 Pure hypercholesterolemia, unspecified: Secondary | ICD-10-CM | POA: Diagnosis not present

## 2015-06-10 DIAGNOSIS — N200 Calculus of kidney: Secondary | ICD-10-CM | POA: Diagnosis not present

## 2015-06-10 DIAGNOSIS — E1365 Other specified diabetes mellitus with hyperglycemia: Secondary | ICD-10-CM | POA: Diagnosis not present

## 2015-06-10 DIAGNOSIS — I10 Essential (primary) hypertension: Secondary | ICD-10-CM | POA: Diagnosis not present

## 2015-06-10 DIAGNOSIS — I482 Chronic atrial fibrillation: Secondary | ICD-10-CM | POA: Diagnosis not present

## 2015-06-10 DIAGNOSIS — E782 Mixed hyperlipidemia: Secondary | ICD-10-CM | POA: Diagnosis not present

## 2015-06-15 DIAGNOSIS — E119 Type 2 diabetes mellitus without complications: Secondary | ICD-10-CM | POA: Diagnosis not present

## 2015-06-15 DIAGNOSIS — I10 Essential (primary) hypertension: Secondary | ICD-10-CM | POA: Diagnosis not present

## 2015-06-17 DIAGNOSIS — I480 Paroxysmal atrial fibrillation: Secondary | ICD-10-CM | POA: Diagnosis not present

## 2015-06-17 DIAGNOSIS — E782 Mixed hyperlipidemia: Secondary | ICD-10-CM | POA: Diagnosis not present

## 2015-06-17 DIAGNOSIS — N200 Calculus of kidney: Secondary | ICD-10-CM | POA: Diagnosis not present

## 2015-06-17 DIAGNOSIS — I10 Essential (primary) hypertension: Secondary | ICD-10-CM | POA: Diagnosis not present

## 2015-06-17 DIAGNOSIS — Z23 Encounter for immunization: Secondary | ICD-10-CM | POA: Diagnosis not present

## 2015-06-17 DIAGNOSIS — E1165 Type 2 diabetes mellitus with hyperglycemia: Secondary | ICD-10-CM | POA: Diagnosis not present

## 2015-07-15 DIAGNOSIS — E7800 Pure hypercholesterolemia, unspecified: Secondary | ICD-10-CM | POA: Diagnosis not present

## 2015-07-15 DIAGNOSIS — I10 Essential (primary) hypertension: Secondary | ICD-10-CM | POA: Diagnosis not present

## 2015-08-01 DIAGNOSIS — M170 Bilateral primary osteoarthritis of knee: Secondary | ICD-10-CM | POA: Diagnosis not present

## 2015-08-12 DIAGNOSIS — E119 Type 2 diabetes mellitus without complications: Secondary | ICD-10-CM | POA: Diagnosis not present

## 2015-08-12 DIAGNOSIS — H2513 Age-related nuclear cataract, bilateral: Secondary | ICD-10-CM | POA: Diagnosis not present

## 2015-08-12 DIAGNOSIS — H4311 Vitreous hemorrhage, right eye: Secondary | ICD-10-CM | POA: Diagnosis not present

## 2015-08-19 DIAGNOSIS — E1151 Type 2 diabetes mellitus with diabetic peripheral angiopathy without gangrene: Secondary | ICD-10-CM | POA: Diagnosis not present

## 2015-08-19 DIAGNOSIS — E1140 Type 2 diabetes mellitus with diabetic neuropathy, unspecified: Secondary | ICD-10-CM | POA: Diagnosis not present

## 2015-09-26 DIAGNOSIS — M170 Bilateral primary osteoarthritis of knee: Secondary | ICD-10-CM | POA: Diagnosis not present

## 2015-10-01 DIAGNOSIS — M170 Bilateral primary osteoarthritis of knee: Secondary | ICD-10-CM | POA: Diagnosis not present

## 2015-10-08 DIAGNOSIS — M170 Bilateral primary osteoarthritis of knee: Secondary | ICD-10-CM | POA: Diagnosis not present

## 2015-10-11 DIAGNOSIS — E782 Mixed hyperlipidemia: Secondary | ICD-10-CM | POA: Diagnosis not present

## 2015-10-11 DIAGNOSIS — I10 Essential (primary) hypertension: Secondary | ICD-10-CM | POA: Diagnosis not present

## 2015-10-11 DIAGNOSIS — I482 Chronic atrial fibrillation: Secondary | ICD-10-CM | POA: Diagnosis not present

## 2015-10-11 DIAGNOSIS — E1165 Type 2 diabetes mellitus with hyperglycemia: Secondary | ICD-10-CM | POA: Diagnosis not present

## 2015-10-18 DIAGNOSIS — E782 Mixed hyperlipidemia: Secondary | ICD-10-CM | POA: Diagnosis not present

## 2015-10-18 DIAGNOSIS — I480 Paroxysmal atrial fibrillation: Secondary | ICD-10-CM | POA: Diagnosis not present

## 2015-10-18 DIAGNOSIS — Z1389 Encounter for screening for other disorder: Secondary | ICD-10-CM | POA: Diagnosis not present

## 2015-10-18 DIAGNOSIS — E1165 Type 2 diabetes mellitus with hyperglycemia: Secondary | ICD-10-CM | POA: Diagnosis not present

## 2015-10-18 DIAGNOSIS — I10 Essential (primary) hypertension: Secondary | ICD-10-CM | POA: Diagnosis not present

## 2015-10-18 DIAGNOSIS — N200 Calculus of kidney: Secondary | ICD-10-CM | POA: Diagnosis not present

## 2015-10-29 DIAGNOSIS — E1151 Type 2 diabetes mellitus with diabetic peripheral angiopathy without gangrene: Secondary | ICD-10-CM | POA: Diagnosis not present

## 2015-10-29 DIAGNOSIS — E1140 Type 2 diabetes mellitus with diabetic neuropathy, unspecified: Secondary | ICD-10-CM | POA: Diagnosis not present

## 2015-12-16 DIAGNOSIS — S62502B Fracture of unspecified phalanx of left thumb, initial encounter for open fracture: Secondary | ICD-10-CM | POA: Diagnosis not present

## 2015-12-16 DIAGNOSIS — Z7982 Long term (current) use of aspirin: Secondary | ICD-10-CM | POA: Diagnosis not present

## 2015-12-16 DIAGNOSIS — S62603B Fracture of unspecified phalanx of left middle finger, initial encounter for open fracture: Secondary | ICD-10-CM | POA: Diagnosis not present

## 2015-12-16 DIAGNOSIS — Z23 Encounter for immunization: Secondary | ICD-10-CM | POA: Diagnosis not present

## 2015-12-16 DIAGNOSIS — Z7984 Long term (current) use of oral hypoglycemic drugs: Secondary | ICD-10-CM | POA: Diagnosis not present

## 2015-12-16 DIAGNOSIS — W270XXA Contact with workbench tool, initial encounter: Secondary | ICD-10-CM | POA: Diagnosis not present

## 2015-12-16 DIAGNOSIS — S62605B Fracture of unspecified phalanx of left ring finger, initial encounter for open fracture: Secondary | ICD-10-CM | POA: Diagnosis not present

## 2015-12-16 DIAGNOSIS — E119 Type 2 diabetes mellitus without complications: Secondary | ICD-10-CM | POA: Diagnosis not present

## 2015-12-16 DIAGNOSIS — S62633B Displaced fracture of distal phalanx of left middle finger, initial encounter for open fracture: Secondary | ICD-10-CM | POA: Diagnosis not present

## 2015-12-16 DIAGNOSIS — S61112A Laceration without foreign body of left thumb with damage to nail, initial encounter: Secondary | ICD-10-CM | POA: Diagnosis not present

## 2015-12-16 DIAGNOSIS — I10 Essential (primary) hypertension: Secondary | ICD-10-CM | POA: Diagnosis not present

## 2015-12-16 DIAGNOSIS — S62635B Displaced fracture of distal phalanx of left ring finger, initial encounter for open fracture: Secondary | ICD-10-CM | POA: Diagnosis not present

## 2015-12-16 DIAGNOSIS — Z79899 Other long term (current) drug therapy: Secondary | ICD-10-CM | POA: Diagnosis not present

## 2015-12-18 DIAGNOSIS — S61219D Laceration without foreign body of unspecified finger without damage to nail, subsequent encounter: Secondary | ICD-10-CM | POA: Diagnosis not present

## 2015-12-18 DIAGNOSIS — Z4800 Encounter for change or removal of nonsurgical wound dressing: Secondary | ICD-10-CM | POA: Diagnosis not present

## 2015-12-20 DIAGNOSIS — Z7982 Long term (current) use of aspirin: Secondary | ICD-10-CM | POA: Diagnosis not present

## 2015-12-20 DIAGNOSIS — I10 Essential (primary) hypertension: Secondary | ICD-10-CM | POA: Diagnosis not present

## 2015-12-20 DIAGNOSIS — Z8619 Personal history of other infectious and parasitic diseases: Secondary | ICD-10-CM | POA: Diagnosis not present

## 2015-12-20 DIAGNOSIS — S61219D Laceration without foreign body of unspecified finger without damage to nail, subsequent encounter: Secondary | ICD-10-CM | POA: Diagnosis not present

## 2015-12-20 DIAGNOSIS — Z4802 Encounter for removal of sutures: Secondary | ICD-10-CM | POA: Diagnosis not present

## 2015-12-20 DIAGNOSIS — Z79899 Other long term (current) drug therapy: Secondary | ICD-10-CM | POA: Diagnosis not present

## 2015-12-20 DIAGNOSIS — E119 Type 2 diabetes mellitus without complications: Secondary | ICD-10-CM | POA: Diagnosis not present

## 2015-12-20 DIAGNOSIS — Z7984 Long term (current) use of oral hypoglycemic drugs: Secondary | ICD-10-CM | POA: Diagnosis not present

## 2015-12-24 DIAGNOSIS — I10 Essential (primary) hypertension: Secondary | ICD-10-CM | POA: Diagnosis not present

## 2015-12-24 DIAGNOSIS — Z7984 Long term (current) use of oral hypoglycemic drugs: Secondary | ICD-10-CM | POA: Diagnosis not present

## 2015-12-24 DIAGNOSIS — Z79899 Other long term (current) drug therapy: Secondary | ICD-10-CM | POA: Diagnosis not present

## 2015-12-24 DIAGNOSIS — S61219D Laceration without foreign body of unspecified finger without damage to nail, subsequent encounter: Secondary | ICD-10-CM | POA: Diagnosis not present

## 2015-12-24 DIAGNOSIS — Z4800 Encounter for change or removal of nonsurgical wound dressing: Secondary | ICD-10-CM | POA: Diagnosis not present

## 2015-12-24 DIAGNOSIS — E119 Type 2 diabetes mellitus without complications: Secondary | ICD-10-CM | POA: Diagnosis not present

## 2015-12-24 DIAGNOSIS — Z7982 Long term (current) use of aspirin: Secondary | ICD-10-CM | POA: Diagnosis not present

## 2015-12-24 DIAGNOSIS — S61213D Laceration without foreign body of left middle finger without damage to nail, subsequent encounter: Secondary | ICD-10-CM | POA: Diagnosis not present

## 2015-12-27 DIAGNOSIS — Z4802 Encounter for removal of sutures: Secondary | ICD-10-CM | POA: Diagnosis not present

## 2016-01-28 DIAGNOSIS — E1140 Type 2 diabetes mellitus with diabetic neuropathy, unspecified: Secondary | ICD-10-CM | POA: Diagnosis not present

## 2016-01-28 DIAGNOSIS — E1151 Type 2 diabetes mellitus with diabetic peripheral angiopathy without gangrene: Secondary | ICD-10-CM | POA: Diagnosis not present

## 2016-03-03 DIAGNOSIS — E7800 Pure hypercholesterolemia, unspecified: Secondary | ICD-10-CM | POA: Diagnosis not present

## 2016-03-03 DIAGNOSIS — E782 Mixed hyperlipidemia: Secondary | ICD-10-CM | POA: Diagnosis not present

## 2016-03-03 DIAGNOSIS — I482 Chronic atrial fibrillation: Secondary | ICD-10-CM | POA: Diagnosis not present

## 2016-03-03 DIAGNOSIS — E1165 Type 2 diabetes mellitus with hyperglycemia: Secondary | ICD-10-CM | POA: Diagnosis not present

## 2016-03-03 DIAGNOSIS — I10 Essential (primary) hypertension: Secondary | ICD-10-CM | POA: Diagnosis not present

## 2016-03-06 DIAGNOSIS — N200 Calculus of kidney: Secondary | ICD-10-CM | POA: Diagnosis not present

## 2016-03-06 DIAGNOSIS — I480 Paroxysmal atrial fibrillation: Secondary | ICD-10-CM | POA: Diagnosis not present

## 2016-03-06 DIAGNOSIS — E1165 Type 2 diabetes mellitus with hyperglycemia: Secondary | ICD-10-CM | POA: Diagnosis not present

## 2016-03-06 DIAGNOSIS — Z6841 Body Mass Index (BMI) 40.0 and over, adult: Secondary | ICD-10-CM | POA: Diagnosis not present

## 2016-03-06 DIAGNOSIS — I10 Essential (primary) hypertension: Secondary | ICD-10-CM | POA: Diagnosis not present

## 2016-03-06 DIAGNOSIS — E782 Mixed hyperlipidemia: Secondary | ICD-10-CM | POA: Diagnosis not present

## 2016-03-27 ENCOUNTER — Other Ambulatory Visit: Payer: Self-pay

## 2016-04-02 DIAGNOSIS — E119 Type 2 diabetes mellitus without complications: Secondary | ICD-10-CM | POA: Diagnosis not present

## 2016-04-02 DIAGNOSIS — H2513 Age-related nuclear cataract, bilateral: Secondary | ICD-10-CM | POA: Diagnosis not present

## 2016-04-02 DIAGNOSIS — H538 Other visual disturbances: Secondary | ICD-10-CM | POA: Diagnosis not present

## 2016-04-14 DIAGNOSIS — E1151 Type 2 diabetes mellitus with diabetic peripheral angiopathy without gangrene: Secondary | ICD-10-CM | POA: Diagnosis not present

## 2016-04-14 DIAGNOSIS — E1140 Type 2 diabetes mellitus with diabetic neuropathy, unspecified: Secondary | ICD-10-CM | POA: Diagnosis not present

## 2016-06-30 DIAGNOSIS — E1140 Type 2 diabetes mellitus with diabetic neuropathy, unspecified: Secondary | ICD-10-CM | POA: Diagnosis not present

## 2016-06-30 DIAGNOSIS — E1151 Type 2 diabetes mellitus with diabetic peripheral angiopathy without gangrene: Secondary | ICD-10-CM | POA: Diagnosis not present

## 2016-07-08 DIAGNOSIS — I482 Chronic atrial fibrillation: Secondary | ICD-10-CM | POA: Diagnosis not present

## 2016-07-08 DIAGNOSIS — I10 Essential (primary) hypertension: Secondary | ICD-10-CM | POA: Diagnosis not present

## 2016-07-08 DIAGNOSIS — E782 Mixed hyperlipidemia: Secondary | ICD-10-CM | POA: Diagnosis not present

## 2016-07-08 DIAGNOSIS — E1165 Type 2 diabetes mellitus with hyperglycemia: Secondary | ICD-10-CM | POA: Diagnosis not present

## 2016-07-10 DIAGNOSIS — I10 Essential (primary) hypertension: Secondary | ICD-10-CM | POA: Diagnosis not present

## 2016-07-10 DIAGNOSIS — E1165 Type 2 diabetes mellitus with hyperglycemia: Secondary | ICD-10-CM | POA: Diagnosis not present

## 2016-07-10 DIAGNOSIS — I480 Paroxysmal atrial fibrillation: Secondary | ICD-10-CM | POA: Diagnosis not present

## 2016-07-10 DIAGNOSIS — N200 Calculus of kidney: Secondary | ICD-10-CM | POA: Diagnosis not present

## 2016-07-10 DIAGNOSIS — E782 Mixed hyperlipidemia: Secondary | ICD-10-CM | POA: Diagnosis not present

## 2016-07-10 DIAGNOSIS — Z23 Encounter for immunization: Secondary | ICD-10-CM | POA: Diagnosis not present

## 2016-07-10 DIAGNOSIS — Z6841 Body Mass Index (BMI) 40.0 and over, adult: Secondary | ICD-10-CM | POA: Diagnosis not present

## 2016-09-08 DIAGNOSIS — E1151 Type 2 diabetes mellitus with diabetic peripheral angiopathy without gangrene: Secondary | ICD-10-CM | POA: Diagnosis not present

## 2016-09-08 DIAGNOSIS — E1140 Type 2 diabetes mellitus with diabetic neuropathy, unspecified: Secondary | ICD-10-CM | POA: Diagnosis not present

## 2016-09-09 DIAGNOSIS — M170 Bilateral primary osteoarthritis of knee: Secondary | ICD-10-CM | POA: Diagnosis not present

## 2016-09-16 DIAGNOSIS — M170 Bilateral primary osteoarthritis of knee: Secondary | ICD-10-CM | POA: Diagnosis not present

## 2016-09-23 DIAGNOSIS — M170 Bilateral primary osteoarthritis of knee: Secondary | ICD-10-CM | POA: Diagnosis not present

## 2016-11-11 DIAGNOSIS — I482 Chronic atrial fibrillation: Secondary | ICD-10-CM | POA: Diagnosis not present

## 2016-11-11 DIAGNOSIS — I10 Essential (primary) hypertension: Secondary | ICD-10-CM | POA: Diagnosis not present

## 2016-11-11 DIAGNOSIS — E1165 Type 2 diabetes mellitus with hyperglycemia: Secondary | ICD-10-CM | POA: Diagnosis not present

## 2016-11-11 DIAGNOSIS — E7800 Pure hypercholesterolemia, unspecified: Secondary | ICD-10-CM | POA: Diagnosis not present

## 2016-11-11 DIAGNOSIS — E782 Mixed hyperlipidemia: Secondary | ICD-10-CM | POA: Diagnosis not present

## 2016-11-16 DIAGNOSIS — Z6841 Body Mass Index (BMI) 40.0 and over, adult: Secondary | ICD-10-CM | POA: Diagnosis not present

## 2016-11-16 DIAGNOSIS — I480 Paroxysmal atrial fibrillation: Secondary | ICD-10-CM | POA: Diagnosis not present

## 2016-11-16 DIAGNOSIS — E1165 Type 2 diabetes mellitus with hyperglycemia: Secondary | ICD-10-CM | POA: Diagnosis not present

## 2016-11-16 DIAGNOSIS — E782 Mixed hyperlipidemia: Secondary | ICD-10-CM | POA: Diagnosis not present

## 2016-11-16 DIAGNOSIS — N200 Calculus of kidney: Secondary | ICD-10-CM | POA: Diagnosis not present

## 2016-11-16 DIAGNOSIS — I10 Essential (primary) hypertension: Secondary | ICD-10-CM | POA: Diagnosis not present

## 2016-11-17 DIAGNOSIS — E1151 Type 2 diabetes mellitus with diabetic peripheral angiopathy without gangrene: Secondary | ICD-10-CM | POA: Diagnosis not present

## 2016-11-17 DIAGNOSIS — E1140 Type 2 diabetes mellitus with diabetic neuropathy, unspecified: Secondary | ICD-10-CM | POA: Diagnosis not present

## 2016-12-16 DIAGNOSIS — Z0001 Encounter for general adult medical examination with abnormal findings: Secondary | ICD-10-CM | POA: Diagnosis not present

## 2016-12-16 DIAGNOSIS — E1165 Type 2 diabetes mellitus with hyperglycemia: Secondary | ICD-10-CM | POA: Diagnosis not present

## 2016-12-16 DIAGNOSIS — I10 Essential (primary) hypertension: Secondary | ICD-10-CM | POA: Diagnosis not present

## 2016-12-16 DIAGNOSIS — Z6841 Body Mass Index (BMI) 40.0 and over, adult: Secondary | ICD-10-CM | POA: Diagnosis not present

## 2016-12-16 DIAGNOSIS — E782 Mixed hyperlipidemia: Secondary | ICD-10-CM | POA: Diagnosis not present

## 2016-12-24 DIAGNOSIS — Z7984 Long term (current) use of oral hypoglycemic drugs: Secondary | ICD-10-CM | POA: Diagnosis not present

## 2016-12-24 DIAGNOSIS — R0600 Dyspnea, unspecified: Secondary | ICD-10-CM | POA: Diagnosis not present

## 2016-12-24 DIAGNOSIS — R0789 Other chest pain: Secondary | ICD-10-CM | POA: Diagnosis not present

## 2016-12-24 DIAGNOSIS — Z6841 Body Mass Index (BMI) 40.0 and over, adult: Secondary | ICD-10-CM | POA: Diagnosis not present

## 2016-12-24 DIAGNOSIS — R0902 Hypoxemia: Secondary | ICD-10-CM | POA: Diagnosis not present

## 2016-12-24 DIAGNOSIS — I10 Essential (primary) hypertension: Secondary | ICD-10-CM | POA: Diagnosis not present

## 2016-12-24 DIAGNOSIS — Z9049 Acquired absence of other specified parts of digestive tract: Secondary | ICD-10-CM | POA: Diagnosis not present

## 2016-12-24 DIAGNOSIS — Z91013 Allergy to seafood: Secondary | ICD-10-CM | POA: Diagnosis not present

## 2016-12-24 DIAGNOSIS — I480 Paroxysmal atrial fibrillation: Secondary | ICD-10-CM | POA: Diagnosis not present

## 2016-12-24 DIAGNOSIS — E785 Hyperlipidemia, unspecified: Secondary | ICD-10-CM | POA: Diagnosis not present

## 2016-12-24 DIAGNOSIS — M1990 Unspecified osteoarthritis, unspecified site: Secondary | ICD-10-CM | POA: Diagnosis not present

## 2016-12-24 DIAGNOSIS — R0602 Shortness of breath: Secondary | ICD-10-CM | POA: Diagnosis not present

## 2016-12-24 DIAGNOSIS — Z7982 Long term (current) use of aspirin: Secondary | ICD-10-CM | POA: Diagnosis not present

## 2016-12-24 DIAGNOSIS — Z79899 Other long term (current) drug therapy: Secondary | ICD-10-CM | POA: Diagnosis not present

## 2016-12-24 DIAGNOSIS — N289 Disorder of kidney and ureter, unspecified: Secondary | ICD-10-CM | POA: Diagnosis not present

## 2016-12-24 DIAGNOSIS — Z87442 Personal history of urinary calculi: Secondary | ICD-10-CM | POA: Diagnosis not present

## 2016-12-24 DIAGNOSIS — E119 Type 2 diabetes mellitus without complications: Secondary | ICD-10-CM | POA: Diagnosis not present

## 2016-12-25 DIAGNOSIS — Z79899 Other long term (current) drug therapy: Secondary | ICD-10-CM | POA: Diagnosis not present

## 2016-12-25 DIAGNOSIS — E1122 Type 2 diabetes mellitus with diabetic chronic kidney disease: Secondary | ICD-10-CM | POA: Diagnosis present

## 2016-12-25 DIAGNOSIS — I200 Unstable angina: Secondary | ICD-10-CM | POA: Diagnosis not present

## 2016-12-25 DIAGNOSIS — E781 Pure hyperglyceridemia: Secondary | ICD-10-CM | POA: Diagnosis present

## 2016-12-25 DIAGNOSIS — Z7982 Long term (current) use of aspirin: Secondary | ICD-10-CM | POA: Diagnosis not present

## 2016-12-25 DIAGNOSIS — Z7984 Long term (current) use of oral hypoglycemic drugs: Secondary | ICD-10-CM | POA: Diagnosis not present

## 2016-12-25 DIAGNOSIS — I10 Essential (primary) hypertension: Secondary | ICD-10-CM | POA: Diagnosis not present

## 2016-12-25 DIAGNOSIS — E785 Hyperlipidemia, unspecified: Secondary | ICD-10-CM | POA: Diagnosis not present

## 2016-12-25 DIAGNOSIS — J449 Chronic obstructive pulmonary disease, unspecified: Secondary | ICD-10-CM | POA: Diagnosis present

## 2016-12-25 DIAGNOSIS — E1169 Type 2 diabetes mellitus with other specified complication: Secondary | ICD-10-CM | POA: Diagnosis present

## 2016-12-25 DIAGNOSIS — E119 Type 2 diabetes mellitus without complications: Secondary | ICD-10-CM | POA: Diagnosis not present

## 2016-12-25 DIAGNOSIS — R279 Unspecified lack of coordination: Secondary | ICD-10-CM | POA: Diagnosis not present

## 2016-12-25 DIAGNOSIS — I129 Hypertensive chronic kidney disease with stage 1 through stage 4 chronic kidney disease, or unspecified chronic kidney disease: Secondary | ICD-10-CM | POA: Diagnosis present

## 2016-12-25 DIAGNOSIS — N400 Enlarged prostate without lower urinary tract symptoms: Secondary | ICD-10-CM | POA: Diagnosis present

## 2016-12-25 DIAGNOSIS — R0789 Other chest pain: Secondary | ICD-10-CM | POA: Diagnosis not present

## 2016-12-25 DIAGNOSIS — I25110 Atherosclerotic heart disease of native coronary artery with unstable angina pectoris: Secondary | ICD-10-CM | POA: Diagnosis present

## 2016-12-25 DIAGNOSIS — I480 Paroxysmal atrial fibrillation: Secondary | ICD-10-CM | POA: Diagnosis not present

## 2016-12-25 DIAGNOSIS — Z743 Need for continuous supervision: Secondary | ICD-10-CM | POA: Diagnosis not present

## 2016-12-25 DIAGNOSIS — N179 Acute kidney failure, unspecified: Secondary | ICD-10-CM | POA: Diagnosis not present

## 2016-12-25 DIAGNOSIS — M170 Bilateral primary osteoarthritis of knee: Secondary | ICD-10-CM | POA: Diagnosis not present

## 2016-12-25 DIAGNOSIS — N182 Chronic kidney disease, stage 2 (mild): Secondary | ICD-10-CM | POA: Diagnosis present

## 2016-12-25 DIAGNOSIS — E876 Hypokalemia: Secondary | ICD-10-CM | POA: Diagnosis not present

## 2016-12-25 DIAGNOSIS — R0600 Dyspnea, unspecified: Secondary | ICD-10-CM | POA: Diagnosis not present

## 2016-12-25 DIAGNOSIS — I517 Cardiomegaly: Secondary | ICD-10-CM | POA: Diagnosis not present

## 2016-12-25 DIAGNOSIS — Z91013 Allergy to seafood: Secondary | ICD-10-CM | POA: Diagnosis not present

## 2016-12-25 DIAGNOSIS — I083 Combined rheumatic disorders of mitral, aortic and tricuspid valves: Secondary | ICD-10-CM | POA: Diagnosis not present

## 2016-12-25 DIAGNOSIS — R0602 Shortness of breath: Secondary | ICD-10-CM | POA: Diagnosis not present

## 2016-12-25 DIAGNOSIS — G8929 Other chronic pain: Secondary | ICD-10-CM | POA: Diagnosis present

## 2016-12-25 DIAGNOSIS — E784 Other hyperlipidemia: Secondary | ICD-10-CM | POA: Diagnosis present

## 2016-12-25 DIAGNOSIS — R0609 Other forms of dyspnea: Secondary | ICD-10-CM | POA: Diagnosis not present

## 2016-12-25 DIAGNOSIS — I340 Nonrheumatic mitral (valve) insufficiency: Secondary | ICD-10-CM | POA: Diagnosis present

## 2016-12-25 DIAGNOSIS — Z6841 Body Mass Index (BMI) 40.0 and over, adult: Secondary | ICD-10-CM | POA: Diagnosis not present

## 2017-01-06 DIAGNOSIS — Z6841 Body Mass Index (BMI) 40.0 and over, adult: Secondary | ICD-10-CM | POA: Diagnosis not present

## 2017-01-06 DIAGNOSIS — E1165 Type 2 diabetes mellitus with hyperglycemia: Secondary | ICD-10-CM | POA: Diagnosis not present

## 2017-01-06 DIAGNOSIS — I10 Essential (primary) hypertension: Secondary | ICD-10-CM | POA: Diagnosis not present

## 2017-01-06 DIAGNOSIS — I480 Paroxysmal atrial fibrillation: Secondary | ICD-10-CM | POA: Diagnosis not present

## 2017-01-06 DIAGNOSIS — E782 Mixed hyperlipidemia: Secondary | ICD-10-CM | POA: Diagnosis not present

## 2017-01-07 DIAGNOSIS — Z6841 Body Mass Index (BMI) 40.0 and over, adult: Secondary | ICD-10-CM | POA: Diagnosis not present

## 2017-01-07 DIAGNOSIS — I200 Unstable angina: Secondary | ICD-10-CM | POA: Diagnosis not present

## 2017-01-07 DIAGNOSIS — I10 Essential (primary) hypertension: Secondary | ICD-10-CM | POA: Diagnosis not present

## 2017-01-07 DIAGNOSIS — I480 Paroxysmal atrial fibrillation: Secondary | ICD-10-CM | POA: Diagnosis not present

## 2017-01-07 DIAGNOSIS — I2510 Atherosclerotic heart disease of native coronary artery without angina pectoris: Secondary | ICD-10-CM | POA: Diagnosis not present

## 2017-02-16 DIAGNOSIS — E1140 Type 2 diabetes mellitus with diabetic neuropathy, unspecified: Secondary | ICD-10-CM | POA: Diagnosis not present

## 2017-02-16 DIAGNOSIS — E1151 Type 2 diabetes mellitus with diabetic peripheral angiopathy without gangrene: Secondary | ICD-10-CM | POA: Diagnosis not present

## 2017-03-04 DIAGNOSIS — E7800 Pure hypercholesterolemia, unspecified: Secondary | ICD-10-CM | POA: Diagnosis not present

## 2017-03-04 DIAGNOSIS — E782 Mixed hyperlipidemia: Secondary | ICD-10-CM | POA: Diagnosis not present

## 2017-03-04 DIAGNOSIS — E1165 Type 2 diabetes mellitus with hyperglycemia: Secondary | ICD-10-CM | POA: Diagnosis not present

## 2017-03-04 DIAGNOSIS — I10 Essential (primary) hypertension: Secondary | ICD-10-CM | POA: Diagnosis not present

## 2017-03-09 DIAGNOSIS — E782 Mixed hyperlipidemia: Secondary | ICD-10-CM | POA: Diagnosis not present

## 2017-03-09 DIAGNOSIS — I10 Essential (primary) hypertension: Secondary | ICD-10-CM | POA: Diagnosis not present

## 2017-03-09 DIAGNOSIS — N200 Calculus of kidney: Secondary | ICD-10-CM | POA: Diagnosis not present

## 2017-03-09 DIAGNOSIS — Z6841 Body Mass Index (BMI) 40.0 and over, adult: Secondary | ICD-10-CM | POA: Diagnosis not present

## 2017-03-09 DIAGNOSIS — I480 Paroxysmal atrial fibrillation: Secondary | ICD-10-CM | POA: Diagnosis not present

## 2017-03-09 DIAGNOSIS — E1165 Type 2 diabetes mellitus with hyperglycemia: Secondary | ICD-10-CM | POA: Diagnosis not present

## 2017-03-15 DIAGNOSIS — E669 Obesity, unspecified: Secondary | ICD-10-CM | POA: Diagnosis not present

## 2017-03-15 DIAGNOSIS — I2510 Atherosclerotic heart disease of native coronary artery without angina pectoris: Secondary | ICD-10-CM | POA: Diagnosis not present

## 2017-03-15 DIAGNOSIS — I200 Unstable angina: Secondary | ICD-10-CM | POA: Diagnosis not present

## 2017-03-15 DIAGNOSIS — I10 Essential (primary) hypertension: Secondary | ICD-10-CM | POA: Diagnosis not present

## 2017-03-15 DIAGNOSIS — Z6841 Body Mass Index (BMI) 40.0 and over, adult: Secondary | ICD-10-CM | POA: Diagnosis not present

## 2017-05-11 DIAGNOSIS — E1140 Type 2 diabetes mellitus with diabetic neuropathy, unspecified: Secondary | ICD-10-CM | POA: Diagnosis not present

## 2017-05-11 DIAGNOSIS — E1151 Type 2 diabetes mellitus with diabetic peripheral angiopathy without gangrene: Secondary | ICD-10-CM | POA: Diagnosis not present

## 2017-06-30 DIAGNOSIS — E782 Mixed hyperlipidemia: Secondary | ICD-10-CM | POA: Diagnosis not present

## 2017-06-30 DIAGNOSIS — E1165 Type 2 diabetes mellitus with hyperglycemia: Secondary | ICD-10-CM | POA: Diagnosis not present

## 2017-06-30 DIAGNOSIS — E7800 Pure hypercholesterolemia, unspecified: Secondary | ICD-10-CM | POA: Diagnosis not present

## 2017-06-30 DIAGNOSIS — I482 Chronic atrial fibrillation: Secondary | ICD-10-CM | POA: Diagnosis not present

## 2017-06-30 DIAGNOSIS — I10 Essential (primary) hypertension: Secondary | ICD-10-CM | POA: Diagnosis not present

## 2017-07-02 DIAGNOSIS — H34211 Partial retinal artery occlusion, right eye: Secondary | ICD-10-CM | POA: Diagnosis not present

## 2017-07-02 DIAGNOSIS — I482 Chronic atrial fibrillation: Secondary | ICD-10-CM | POA: Diagnosis not present

## 2017-07-02 DIAGNOSIS — R51 Headache: Secondary | ICD-10-CM | POA: Diagnosis not present

## 2017-07-02 DIAGNOSIS — I10 Essential (primary) hypertension: Secondary | ICD-10-CM | POA: Diagnosis not present

## 2017-07-02 DIAGNOSIS — H538 Other visual disturbances: Secondary | ICD-10-CM | POA: Diagnosis not present

## 2017-07-02 DIAGNOSIS — I6523 Occlusion and stenosis of bilateral carotid arteries: Secondary | ICD-10-CM | POA: Diagnosis not present

## 2017-07-07 DIAGNOSIS — Z6841 Body Mass Index (BMI) 40.0 and over, adult: Secondary | ICD-10-CM | POA: Diagnosis not present

## 2017-07-07 DIAGNOSIS — I480 Paroxysmal atrial fibrillation: Secondary | ICD-10-CM | POA: Diagnosis not present

## 2017-07-07 DIAGNOSIS — E1165 Type 2 diabetes mellitus with hyperglycemia: Secondary | ICD-10-CM | POA: Diagnosis not present

## 2017-07-07 DIAGNOSIS — Z23 Encounter for immunization: Secondary | ICD-10-CM | POA: Diagnosis not present

## 2017-07-07 DIAGNOSIS — E782 Mixed hyperlipidemia: Secondary | ICD-10-CM | POA: Diagnosis not present

## 2017-07-07 DIAGNOSIS — N200 Calculus of kidney: Secondary | ICD-10-CM | POA: Diagnosis not present

## 2017-07-07 DIAGNOSIS — I10 Essential (primary) hypertension: Secondary | ICD-10-CM | POA: Diagnosis not present

## 2017-07-20 DIAGNOSIS — E1140 Type 2 diabetes mellitus with diabetic neuropathy, unspecified: Secondary | ICD-10-CM | POA: Diagnosis not present

## 2017-07-20 DIAGNOSIS — E1151 Type 2 diabetes mellitus with diabetic peripheral angiopathy without gangrene: Secondary | ICD-10-CM | POA: Diagnosis not present

## 2017-08-02 DIAGNOSIS — H34211 Partial retinal artery occlusion, right eye: Secondary | ICD-10-CM | POA: Diagnosis not present

## 2017-08-02 DIAGNOSIS — H2513 Age-related nuclear cataract, bilateral: Secondary | ICD-10-CM | POA: Diagnosis not present

## 2017-09-28 DIAGNOSIS — E1151 Type 2 diabetes mellitus with diabetic peripheral angiopathy without gangrene: Secondary | ICD-10-CM | POA: Diagnosis not present

## 2017-09-28 DIAGNOSIS — E1140 Type 2 diabetes mellitus with diabetic neuropathy, unspecified: Secondary | ICD-10-CM | POA: Diagnosis not present

## 2017-10-27 DIAGNOSIS — Z6841 Body Mass Index (BMI) 40.0 and over, adult: Secondary | ICD-10-CM | POA: Diagnosis not present

## 2017-10-27 DIAGNOSIS — M170 Bilateral primary osteoarthritis of knee: Secondary | ICD-10-CM | POA: Diagnosis not present

## 2017-10-28 DIAGNOSIS — I482 Chronic atrial fibrillation: Secondary | ICD-10-CM | POA: Diagnosis not present

## 2017-10-28 DIAGNOSIS — E7800 Pure hypercholesterolemia, unspecified: Secondary | ICD-10-CM | POA: Diagnosis not present

## 2017-10-28 DIAGNOSIS — E1365 Other specified diabetes mellitus with hyperglycemia: Secondary | ICD-10-CM | POA: Diagnosis not present

## 2017-10-28 DIAGNOSIS — E1165 Type 2 diabetes mellitus with hyperglycemia: Secondary | ICD-10-CM | POA: Diagnosis not present

## 2017-10-28 DIAGNOSIS — E782 Mixed hyperlipidemia: Secondary | ICD-10-CM | POA: Diagnosis not present

## 2017-10-28 DIAGNOSIS — I10 Essential (primary) hypertension: Secondary | ICD-10-CM | POA: Diagnosis not present

## 2017-11-03 DIAGNOSIS — M170 Bilateral primary osteoarthritis of knee: Secondary | ICD-10-CM | POA: Diagnosis not present

## 2017-11-04 DIAGNOSIS — Z6841 Body Mass Index (BMI) 40.0 and over, adult: Secondary | ICD-10-CM | POA: Diagnosis not present

## 2017-11-04 DIAGNOSIS — I480 Paroxysmal atrial fibrillation: Secondary | ICD-10-CM | POA: Diagnosis not present

## 2017-11-04 DIAGNOSIS — E782 Mixed hyperlipidemia: Secondary | ICD-10-CM | POA: Diagnosis not present

## 2017-11-04 DIAGNOSIS — E1165 Type 2 diabetes mellitus with hyperglycemia: Secondary | ICD-10-CM | POA: Diagnosis not present

## 2017-11-04 DIAGNOSIS — I10 Essential (primary) hypertension: Secondary | ICD-10-CM | POA: Diagnosis not present

## 2017-11-04 DIAGNOSIS — N200 Calculus of kidney: Secondary | ICD-10-CM | POA: Diagnosis not present

## 2017-11-10 DIAGNOSIS — M170 Bilateral primary osteoarthritis of knee: Secondary | ICD-10-CM | POA: Diagnosis not present

## 2017-12-01 ENCOUNTER — Encounter (HOSPITAL_COMMUNITY): Payer: Self-pay | Admitting: Emergency Medicine

## 2017-12-01 ENCOUNTER — Emergency Department (HOSPITAL_COMMUNITY): Payer: Medicare Other

## 2017-12-01 ENCOUNTER — Emergency Department (HOSPITAL_COMMUNITY)
Admission: EM | Admit: 2017-12-01 | Discharge: 2017-12-01 | Disposition: A | Discharge status: Home / Self Care | Payer: Medicare Other | Attending: Emergency Medicine | Admitting: Emergency Medicine

## 2017-12-01 ENCOUNTER — Other Ambulatory Visit: Payer: Self-pay

## 2017-12-01 DIAGNOSIS — I10 Essential (primary) hypertension: Secondary | ICD-10-CM | POA: Diagnosis not present

## 2017-12-01 DIAGNOSIS — Z79899 Other long term (current) drug therapy: Secondary | ICD-10-CM | POA: Insufficient documentation

## 2017-12-01 DIAGNOSIS — Z7982 Long term (current) use of aspirin: Secondary | ICD-10-CM | POA: Diagnosis not present

## 2017-12-01 DIAGNOSIS — E7800 Pure hypercholesterolemia, unspecified: Secondary | ICD-10-CM | POA: Diagnosis not present

## 2017-12-01 DIAGNOSIS — E119 Type 2 diabetes mellitus without complications: Secondary | ICD-10-CM | POA: Diagnosis not present

## 2017-12-01 DIAGNOSIS — Z7984 Long term (current) use of oral hypoglycemic drugs: Secondary | ICD-10-CM | POA: Diagnosis not present

## 2017-12-01 DIAGNOSIS — R7989 Other specified abnormal findings of blood chemistry: Secondary | ICD-10-CM | POA: Diagnosis not present

## 2017-12-01 DIAGNOSIS — I4891 Unspecified atrial fibrillation: Secondary | ICD-10-CM | POA: Insufficient documentation

## 2017-12-01 DIAGNOSIS — N200 Calculus of kidney: Secondary | ICD-10-CM | POA: Insufficient documentation

## 2017-12-01 DIAGNOSIS — R1084 Generalized abdominal pain: Secondary | ICD-10-CM | POA: Diagnosis present

## 2017-12-01 DIAGNOSIS — R319 Hematuria, unspecified: Secondary | ICD-10-CM | POA: Diagnosis not present

## 2017-12-01 DIAGNOSIS — R944 Abnormal results of kidney function studies: Secondary | ICD-10-CM | POA: Diagnosis not present

## 2017-12-01 DIAGNOSIS — N201 Calculus of ureter: Secondary | ICD-10-CM | POA: Diagnosis not present

## 2017-12-01 HISTORY — DX: Essential (primary) hypertension: I10

## 2017-12-01 HISTORY — DX: Unspecified atrial fibrillation: I48.91

## 2017-12-01 HISTORY — DX: Unspecified osteoarthritis, unspecified site: M19.90

## 2017-12-01 HISTORY — DX: Pure hypercholesterolemia, unspecified: E78.00

## 2017-12-01 HISTORY — DX: Type 2 diabetes mellitus without complications: E11.9

## 2017-12-01 HISTORY — DX: Inflammatory liver disease, unspecified: K75.9

## 2017-12-01 HISTORY — DX: Disorder of kidney and ureter, unspecified: N28.9

## 2017-12-01 HISTORY — DX: Malignant (primary) neoplasm, unspecified: C80.1

## 2017-12-01 LAB — URINALYSIS, ROUTINE W REFLEX MICROSCOPIC
Bilirubin Urine: NEGATIVE
GLUCOSE, UA: NEGATIVE mg/dL
Ketones, ur: NEGATIVE mg/dL
Leukocytes, UA: NEGATIVE
Nitrite: NEGATIVE
PROTEIN: NEGATIVE mg/dL
SPECIFIC GRAVITY, URINE: 1.016 (ref 1.005–1.030)
pH: 5 (ref 5.0–8.0)

## 2017-12-01 LAB — CBC WITH DIFFERENTIAL/PLATELET
BASOS PCT: 0 %
Basophils Absolute: 0 10*3/uL (ref 0.0–0.1)
Eosinophils Absolute: 0.3 10*3/uL (ref 0.0–0.7)
Eosinophils Relative: 3 %
HEMATOCRIT: 41.3 % (ref 39.0–52.0)
HEMOGLOBIN: 13.4 g/dL (ref 13.0–17.0)
LYMPHS PCT: 16 %
Lymphs Abs: 1.3 10*3/uL (ref 0.7–4.0)
MCH: 29.5 pg (ref 26.0–34.0)
MCHC: 32.4 g/dL (ref 30.0–36.0)
MCV: 91 fL (ref 78.0–100.0)
Monocytes Absolute: 0.6 10*3/uL (ref 0.1–1.0)
Monocytes Relative: 8 %
NEUTROS PCT: 73 %
Neutro Abs: 5.7 10*3/uL (ref 1.7–7.7)
Platelets: 176 10*3/uL (ref 150–400)
RBC: 4.54 MIL/uL (ref 4.22–5.81)
RDW: 13.5 % (ref 11.5–15.5)
WBC: 7.8 10*3/uL (ref 4.0–10.5)

## 2017-12-01 LAB — COMPREHENSIVE METABOLIC PANEL
ALBUMIN: 3.3 g/dL — AB (ref 3.5–5.0)
ALT: 21 U/L (ref 17–63)
AST: 18 U/L (ref 15–41)
Alkaline Phosphatase: 66 U/L (ref 38–126)
Anion gap: 12 (ref 5–15)
BILIRUBIN TOTAL: 1.1 mg/dL (ref 0.3–1.2)
BUN: 26 mg/dL — AB (ref 6–20)
CO2: 20 mmol/L — ABNORMAL LOW (ref 22–32)
CREATININE: 1.86 mg/dL — AB (ref 0.61–1.24)
Calcium: 9 mg/dL (ref 8.9–10.3)
Chloride: 108 mmol/L (ref 101–111)
GFR calc Af Amer: 39 mL/min — ABNORMAL LOW (ref >60)
GFR, EST NON AFRICAN AMERICAN: 34 mL/min — AB (ref >60)
GLUCOSE: 162 mg/dL — AB (ref 65–99)
Potassium: 3.9 mmol/L (ref 3.5–5.1)
Sodium: 140 mmol/L (ref 135–145)
TOTAL PROTEIN: 6.8 g/dL (ref 6.5–8.1)

## 2017-12-01 MED ORDER — ONDANSETRON HCL 4 MG PO TABS: 4.0000 mg | ORAL_TABLET | Freq: Four times a day (QID) | ORAL | 0 refills | Status: AC

## 2017-12-01 MED ORDER — OXYCODONE-ACETAMINOPHEN 5-325 MG PO TABS: 1.0000 | ORAL_TABLET | ORAL | 0 refills | Status: AC | PRN

## 2017-12-01 NOTE — ED Provider Notes (Signed)
[facility] EMERGENCY DEPARTMENT Provider Note   CSN: [id number] Arrival date & time: 12/01/17  9924     History   Chief Complaint Chief Complaint  Patient presents with  . Flank Pain    HPI Jay Floyd is a 77 y.o. male.  Right flank pain for 1 week without dysuria, hematuria, frequency.  Patient has an extensive history of kidney stones requiring urological procedures including lithotripsy.  No fever, sweats, chills.  Severity of pain is mild to moderate.  Nothing makes pain better or worse.  He has an appointment scheduled with Dr. Jeffie Pollock on June 7.     Past Medical History:  Diagnosis Date  . Arthritis   . Atrial fibrillation ([place])   . Carcinoma ([place])   . Diabetes mellitus without complication ([place])   . Hepatitis   . Hypercholesteremia   . Hypertension   . Renal disorder     There are no active problems to display for this patient.   Past Surgical History:  Procedure Laterality Date  . ACHILLES TENDON REPAIR    . CHOLECYSTECTOMY    . CIRCUMCISION    . CLAVICLE SURGERY    . LITHOTRIPSY          Home Medications    Prior to Admission medications   Medication Sig Start Date End Date Taking? Authorizing Provider  aspirin EC 81 MG tablet Take 81 mg by mouth daily.   Yes [provider]  Cholecalciferol (D 1000) 1000 units capsule Take 1 capsule by mouth daily.   Yes [provider]  diclofenac (VOLTAREN) 50 MG EC tablet Take 50 mg by mouth daily.   Yes [provider]  glucosamine-chondroitin 500-400 MG tablet Take 1 tablet by mouth daily.   Yes [provider]  losartan (COZAAR) 100 MG tablet Take 100 mg by mouth daily.   Yes [provider]  metFORMIN (GLUCOPHAGE) 1000 MG tablet Take 1,000 mg by mouth 2 (two) times daily with a meal.   Yes [provider]  nystatin-triamcinolone (MYCOLOG II) cream Apply 1 application topically 2 (two) times daily. 11/23/17  Yes [provider]  Pyridoxine HCl  (B-6) 100 MG TABS Take 1 tablet by mouth daily.   Yes [provider]  simvastatin (ZOCOR) 10 MG tablet Take 10 mg by mouth daily at 6 PM.   Yes [provider]  tamsulosin (FLOMAX) 0.4 MG CAPS capsule Take 0.4 mg by mouth daily.   Yes [provider]  ondansetron (ZOFRAN) 4 MG tablet Take 1 tablet (4 mg total) by mouth every 6 (six) hours. 12/01/17   Nat Christen, MD  oxyCODONE-acetaminophen (PERCOCET) 5-325 MG tablet Take 1 tablet by mouth every 4 (four) hours as needed. 12/01/17   Nat Christen, MD    Family History History reviewed. No pertinent family history.  Social History Social History   Tobacco Use  . Smoking status: Never Smoker  . Smokeless tobacco: Never Used  Substance Use Topics  . Alcohol use: Never    Frequency: Never  . Drug use: Never     Allergies   Iodine and Shellfish allergy   Review of Systems Review of Systems  All other systems reviewed and are negative.    Physical Exam Updated Vital Signs BP (!) 173/101 (BP Location: Left Arm)   Pulse 86   Temp 97.9 F (36.6 C) (Oral)   Resp 18   Ht 6\' 1"  (1.854 m)   Wt (!) 150.1 kg (331 lb)   SpO2 98%  BMI 43.67 kg/m   Physical Exam  Constitutional: He is oriented to person, place, and time. He appears well-developed and well-nourished.  nad  HENT:  Head: Normocephalic and atraumatic.  Eyes: Conjunctivae are normal.  Neck: Neck supple.  Cardiovascular: Normal rate and regular rhythm.  Pulmonary/Chest: Effort normal and breath sounds normal.  Abdominal: Soft. Bowel sounds are normal.  Genitourinary:  Genitourinary Comments: Minimal left flank tenderness.  Musculoskeletal: Normal range of motion.  Neurological: He is alert and oriented to person, place, and time.  Skin: Skin is warm and dry.  Psychiatric: He has a normal mood and affect. His behavior is normal.  Nursing note and vitals reviewed.    ED Treatments / Results  Labs (all labs ordered are listed, but only  abnormal results are displayed) Labs Reviewed  COMPREHENSIVE METABOLIC PANEL - Abnormal; Notable for the following components:      Result Value   CO2 20 (*)    Glucose, Bld 162 (*)    BUN 26 (*)    Creatinine, Ser 1.86 (*)    Albumin 3.3 (*)    GFR calc non Af Amer 34 (*)    GFR calc Af Amer 39 (*)    All other components within normal limits  URINALYSIS, ROUTINE W REFLEX MICROSCOPIC - Abnormal; Notable for the following components:   Hgb urine dipstick SMALL (*)    Bacteria, UA RARE (*)    All other components within normal limits  CBC WITH DIFFERENTIAL/PLATELET    EKG None  Radiology Ct Renal Stone Study  Result Date: 12/01/2017 CLINICAL DATA:  Right flank pain and hematuria EXAM: CT ABDOMEN AND PELVIS WITHOUT CONTRAST TECHNIQUE: Multidetector CT imaging of the abdomen and pelvis was performed following the standard protocol without oral or IV contrast. COMPARISON:  None. FINDINGS: Lower chest: There is scarring in the posterior left lung base. There is no lung base edema or consolidation. There are foci of coronary artery calcification. Hepatobiliary: No focal liver lesions are evident on this noncontrast enhanced study. Gallbladder is absent. There is no appreciable biliary duct dilatation. Pancreas: There is no evident pancreatic mass or inflammatory focus. Spleen: No splenic lesions are evident. Adrenals/Urinary Tract: Right adrenal appears normal. There is a left adrenal adenoma measuring 1.5 x 1.2 cm. There is mild perinephric stranding surrounding both kidneys, more on the right than on the left. No renal masses evident on either side. There is moderate hydronephrosis on the right. There is no appreciable hydronephrosis on the left. There are two, 1 mm calculi in the posterior mid right kidney. There is a 1 mm calculus with a 4 x 3 mm calculus slightly more superior in the posterior mid right kidney. Somewhat more inferiorly on the right, there is a 3 x 2 mm calculus. On the left,  there are scattered 1 mm calculi in the upper to mid kidney. There are calculi in the right ureter at the mid sacral level. Specifically, there is a 5 x 4 mm calculus within immediately adjacent 6 x 4 mm calculus in the ureter at this site. There is a probable 1 mm calculus between these larger calculi as well. Note that there is dilatation of the right ureter and stranding along the course of the right ureter proximal to these calculi. The distal right ureter is of normal contour. No ureteral calculi are seen on the left. Urinary bladder is midline with wall thickness within normal limits. Stomach/Bowel: There is no appreciable bowel wall or mesenteric thickening. No evident  bowel obstruction. No free air or portal venous air. Vascular/Lymphatic: There is atherosclerotic calcification in the aorta. Aorta is tortuous, but no aneurysm is evident. Major mesenteric arterial vessels appear patent on this noncontrast enhanced study. There is no appreciable adenopathy in the abdomen or pelvis. Reproductive: Prostate and seminal vesicles are normal in size and contour. No pelvic mass evident. Other: Appendix appears normal. No abscess or ascites evident in the abdomen or pelvis. There is a small ventral hernia containing only fat. There is mild scarring in the anterior abdominal wall in the periumbilical region, likely of postoperative etiology. Musculoskeletal: There is degenerative change in the lower thoracic and lumbar spine regions. There are no blastic or lytic bone lesions. There is incomplete visualization of a lipoma in the right rectus femorals muscle measuring 3.9 x 2.6 cm. Visualized muscular structures otherwise appear normal. IMPRESSION: 1. There are calculi in the distal right ureter measuring 5.4 and 6.4 mm respectively which are immediately adjacent to each other in the right ureter with moderate hydronephrosis and ureterectasis proximal to these calculi. There are intrarenal calculi bilaterally, larger  and more numerous on the right than on the left. There is perinephric stranding bilaterally, more on the right than on the left. No left-sided ureteral calculus. 2.  No bowel obstruction.  No abscess.  Appendix appears normal. 3. Aortic atherosclerosis. No aneurysm. There also foci coronary artery calcification. 4. Small left adrenal adenoma, a benign finding. 5.  Gallbladder absent. 6. Small ventral hernia containing only fat. Scarring in the anterior abdominal wall, likely of postoperative etiology. 7.  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 Aortic Atherosclerosis (ICD10-I70.0). Electronically Signed   By: Lowella Grip III M.D.   On: 12/01/2017 09:36    Procedures Procedures (including critical care time)  Medications Ordered in ED Medications - No data to display   Initial Impression / Assessment and Plan / ED Course  I have reviewed the triage vital signs and the nursing notes.  Pertinent labs & imaging results that were available during my care of the patient were reviewed by me and considered in my medical decision making (see chart for details).     Patient presents with right flank pain for 1 week.  He has an extensive history of kidney stones.  CT scan reveals 2 right distal ureteral stones, 5.4 cm and 6.4 cm, with mild hydronephrosis.  Additionally, his creatinine is elevated at 1.86.  These findings were discussed with the patient and his son.  I also discussed the clinical scenario with the urologist on-call.  Patient will follow up with urology this week.  Discharge medications Percocet and Zofran 4 mg.  Final Clinical Impressions(s) / ED Diagnoses   Final diagnoses:  Right kidney stone  Creatinine elevation    ED Discharge Orders        Ordered    oxyCODONE-acetaminophen (PERCOCET) 5-325 MG tablet  Every 4 hours PRN     12/01/17 1121    ondansetron (ZOFRAN) 4 MG tablet  Every 6 hours     12/01/17 1121       Nat Christen, MD 12/01/17 1129

## 2017-12-01 NOTE — ED Notes (Signed)
Patient transported to CT 

## 2017-12-01 NOTE — ED Triage Notes (Signed)
Pt c/o of right flank pain with difficulty starting a urinary stream x 1 week.  Pt states having a history of kidney stones.

## 2017-12-01 NOTE — Discharge Instructions (Addendum)
You have 2 kidney stones in your right ureter, one is 5+ centimeters and one is 6+ centimeters.  Additionally, the kidney test called creatinine is slightly elevated at 1.86.  You must follow-up with urology this week.  Phone number given to the to urology clinics, one in [place], one in [place].  Prescription for pain and nausea medicine.

## 2017-12-08 ENCOUNTER — Other Ambulatory Visit: Payer: Self-pay | Admitting: Urology

## 2017-12-08 DIAGNOSIS — I10 Essential (primary) hypertension: Secondary | ICD-10-CM | POA: Diagnosis not present

## 2017-12-08 DIAGNOSIS — I480 Paroxysmal atrial fibrillation: Secondary | ICD-10-CM | POA: Diagnosis not present

## 2017-12-08 DIAGNOSIS — I2510 Atherosclerotic heart disease of native coronary artery without angina pectoris: Secondary | ICD-10-CM | POA: Diagnosis not present

## 2017-12-09 ENCOUNTER — Encounter (HOSPITAL_COMMUNITY): Payer: Self-pay

## 2017-12-09 ENCOUNTER — Encounter (HOSPITAL_COMMUNITY)
Admission: RE | Admit: 2017-12-09 | Discharge: 2017-12-09 | Disposition: A | Discharge status: Home / Self Care | Payer: Medicare Other | Source: Ambulatory Visit | Attending: Urology | Admitting: Urology

## 2017-12-09 ENCOUNTER — Other Ambulatory Visit: Payer: Self-pay

## 2017-12-09 DIAGNOSIS — R0609 Other forms of dyspnea: Secondary | ICD-10-CM | POA: Diagnosis not present

## 2017-12-09 DIAGNOSIS — N289 Disorder of kidney and ureter, unspecified: Secondary | ICD-10-CM | POA: Insufficient documentation

## 2017-12-09 DIAGNOSIS — N201 Calculus of ureter: Secondary | ICD-10-CM | POA: Diagnosis not present

## 2017-12-09 DIAGNOSIS — Z01812 Encounter for preprocedural laboratory examination: Secondary | ICD-10-CM | POA: Diagnosis not present

## 2017-12-09 DIAGNOSIS — I10 Essential (primary) hypertension: Secondary | ICD-10-CM | POA: Insufficient documentation

## 2017-12-09 DIAGNOSIS — E119 Type 2 diabetes mellitus without complications: Secondary | ICD-10-CM | POA: Insufficient documentation

## 2017-12-09 DIAGNOSIS — Z7984 Long term (current) use of oral hypoglycemic drugs: Secondary | ICD-10-CM | POA: Diagnosis not present

## 2017-12-09 DIAGNOSIS — Z01810 Encounter for preprocedural cardiovascular examination: Secondary | ICD-10-CM | POA: Insufficient documentation

## 2017-12-09 DIAGNOSIS — I2510 Atherosclerotic heart disease of native coronary artery without angina pectoris: Secondary | ICD-10-CM | POA: Diagnosis not present

## 2017-12-09 DIAGNOSIS — I4891 Unspecified atrial fibrillation: Secondary | ICD-10-CM | POA: Insufficient documentation

## 2017-12-09 HISTORY — DX: Cardiac arrhythmia, unspecified: I49.9

## 2017-12-09 HISTORY — DX: Dyspnea, unspecified: R06.00

## 2017-12-09 HISTORY — DX: Atherosclerotic heart disease of native coronary artery without angina pectoris: I25.10

## 2017-12-09 HISTORY — DX: Other reaction to spinal and lumbar puncture: G97.1

## 2017-12-09 HISTORY — DX: Failed or difficult intubation, initial encounter: T88.4XXA

## 2017-12-09 HISTORY — DX: Personal history of urinary calculi: Z87.442

## 2017-12-09 LAB — BASIC METABOLIC PANEL
Anion gap: 11 (ref 5–15)
BUN: 26 mg/dL — AB (ref 6–20)
CALCIUM: 9.1 mg/dL (ref 8.9–10.3)
CHLORIDE: 109 mmol/L (ref 101–111)
CO2: 22 mmol/L (ref 22–32)
CREATININE: 1.41 mg/dL — AB (ref 0.61–1.24)
GFR calc Af Amer: 55 mL/min — ABNORMAL LOW (ref >60)
GFR calc non Af Amer: 47 mL/min — ABNORMAL LOW (ref >60)
Glucose, Bld: 152 mg/dL — ABNORMAL HIGH (ref 65–99)
Potassium: 3.8 mmol/L (ref 3.5–5.1)
SODIUM: 142 mmol/L (ref 135–145)

## 2017-12-09 LAB — GLUCOSE, CAPILLARY: Glucose-Capillary: 160 mg/dL — ABNORMAL HIGH (ref 65–99)

## 2017-12-09 LAB — HEMOGLOBIN A1C
HEMOGLOBIN A1C: 7.1 % — AB (ref 4.8–5.6)
Mean Plasma Glucose: 157.07 mg/dL

## 2017-12-09 NOTE — Progress Notes (Signed)
12-08-17 ovn cards on chart clearing for surgery ekg 12-08-17 on chart

## 2017-12-09 NOTE — Progress Notes (Signed)
ovn Dr. Deno Etienne 03-15-18 epic

## 2017-12-09 NOTE — Patient Instructions (Addendum)
Jay Floyd  12/09/2017   Your procedure is scheduled on: 12/13/2017   Report to [facility] Main  Entrance   Report to admitting at  100pm    Call this number if you have problems the morning of surgery [phone]    Remember: Do not eat food  :After Midnight.May have clear liquids from 12 midnite until 0900am morning of surgery then nothing by mouth.      Take these medicines the morning of surgery with A SIP OF WATER: tamulosin (flomax) DO NOT TAKE ANY DIABETIC MEDICATIONS DAY OF YOUR SURGERY                               You may not have any metal on your body including hair pins and              piercings  Do not wear jewelry,  lotions, powders or perfumes, deodorant                          Men may shave face and neck.   Do not bring valuables to the hospital. Jay Floyd.  Contacts, dentures or bridgework may not be worn into surgery.      Patients discharged the day of surgery will not be allowed to drive home.  Name and phone number of your driver:     Jay Floyd Allowed                                                                     Foods Excluded  Coffee and tea, regular and decaf                             liquids that you cannot  Plain Jell-O in any flavor                                             see through such as: Fruit ices (not with fruit pulp)                                     milk, soups, orange juice  Iced Popsicles                                    All solid food Carbonated beverages, regular and diet                                    Cranberry, grape and apple juices Sports drinks like Gatorade Lightly seasoned clear broth or consume(fat free) Sugar, honey syrup  Sample Menu Breakfast                                Lunch                                     Supper Cranberry juice                    Beef broth                            Chicken  broth Jell-O                                     Grape juice                           Apple juice Coffee or tea                        Jell-O                                      Popsicle                                                Coffee or tea                        Coffee or tea  _____________________________________________________________________                Please read over the following fact sheets you were given: _____________________________________________________________________             [facility] - Preparing for Surgery Before surgery, you can play an important role.  Because skin is not sterile, your skin needs to be as free of germs as possible.  You can reduce the number of germs on your skin by washing with CHG (chlorahexidine gluconate) soap before surgery.  CHG is an antiseptic cleaner which kills germs and bonds with the skin to continue killing germs even after washing. Please DO NOT use if you have an allergy to CHG or antibacterial soaps.  If your skin becomes reddened/irritated stop using the CHG and inform your nurse when you arrive at Short Stay. Do not shave (including legs and underarms) for at least 48 hours prior to the first CHG shower.  You may shave your face/neck. Please follow these instructions carefully:  1.  Shower with CHG Soap the night before surgery and the  morning of Surgery.  2.  If you choose to wash your hair, wash your hair first as usual with your  normal  shampoo.  3.  After you shampoo, rinse your hair and body thoroughly to remove the  shampoo.                           4.  Use CHG as you would any other liquid  soap.  You can apply chg directly  to the skin and wash                       Gently with a scrungie or clean washcloth.  5.  Apply the CHG Soap to your body ONLY FROM THE NECK DOWN.   Do not use on face/ open                           Wound or open sores. Avoid contact with eyes, ears mouth and genitals (private parts).                        Wash face,  Genitals (private parts) with your normal soap.             6.  Wash thoroughly, paying special attention to the area where your surgery  will be performed.  7.  Thoroughly rinse your body with warm water from the neck down.  8.  DO NOT shower/wash with your normal soap after using and rinsing off  the CHG Soap.                9.  Pat yourself dry with a clean towel.            10.  Wear clean pajamas.            11.  Place clean sheets on your bed the night of your first shower and do not  sleep with pets. Day of Surgery : Do not apply any lotions/deodorants the morning of surgery.  Please wear clean clothes to the hospital/surgery center.  FAILURE TO FOLLOW THESE INSTRUCTIONS MAY RESULT IN THE CANCELLATION OF YOUR SURGERY PATIENT SIGNATURE_________________________________  NURSE SIGNATURE__________________________________  ________________________________________________________________________

## 2017-12-09 NOTE — Progress Notes (Signed)
BMP routed to Dr. Bell via epic 

## 2017-12-09 NOTE — Progress Notes (Addendum)
Dr. Albertha Ghee aware of pt. Difficult intubation history in 2013. Also aware of pt. Kidney stent surgery in 2013 pt. Woke up with a broken collar bone and rib inconclusive to why. Pt. Also is in afib and his cardiologist has cleared him for surgery. Pt. Also made aware a spinal may be the best option for him per Dr. Marcie Bal .

## 2017-12-12 MED ORDER — CEFAZOLIN SODIUM 10 G IJ SOLR
3.0000 g | INTRAMUSCULAR | Status: AC
  Administered 2017-12-13: 3 g via INTRAVENOUS
  Filled 2017-12-12: qty 3

## 2017-12-13 ENCOUNTER — Ambulatory Visit (HOSPITAL_COMMUNITY)
Admission: RE | Admit: 2017-12-13 | Discharge: 2017-12-13 | Disposition: A | Discharge status: Home / Self Care | Payer: Medicare Other | Source: Ambulatory Visit | Attending: Urology | Admitting: Urology

## 2017-12-13 ENCOUNTER — Other Ambulatory Visit: Payer: Self-pay

## 2017-12-13 ENCOUNTER — Ambulatory Visit (HOSPITAL_COMMUNITY): Payer: Medicare Other | Admitting: Anesthesiology

## 2017-12-13 ENCOUNTER — Encounter (HOSPITAL_COMMUNITY)
Admission: RE | Disposition: A | Discharge status: Home / Self Care | Payer: Self-pay | Source: Ambulatory Visit | Attending: Urology

## 2017-12-13 ENCOUNTER — Encounter (HOSPITAL_COMMUNITY): Payer: Self-pay | Admitting: *Deleted

## 2017-12-13 DIAGNOSIS — Z6841 Body Mass Index (BMI) 40.0 and over, adult: Secondary | ICD-10-CM | POA: Insufficient documentation

## 2017-12-13 DIAGNOSIS — N132 Hydronephrosis with renal and ureteral calculous obstruction: Secondary | ICD-10-CM | POA: Diagnosis not present

## 2017-12-13 DIAGNOSIS — E785 Hyperlipidemia, unspecified: Secondary | ICD-10-CM | POA: Diagnosis not present

## 2017-12-13 DIAGNOSIS — Z791 Long term (current) use of non-steroidal anti-inflammatories (NSAID): Secondary | ICD-10-CM | POA: Diagnosis not present

## 2017-12-13 DIAGNOSIS — Z7984 Long term (current) use of oral hypoglycemic drugs: Secondary | ICD-10-CM | POA: Diagnosis not present

## 2017-12-13 DIAGNOSIS — Z7982 Long term (current) use of aspirin: Secondary | ICD-10-CM | POA: Diagnosis not present

## 2017-12-13 DIAGNOSIS — N475 Adhesions of prepuce and glans penis: Secondary | ICD-10-CM | POA: Diagnosis not present

## 2017-12-13 DIAGNOSIS — E119 Type 2 diabetes mellitus without complications: Secondary | ICD-10-CM | POA: Diagnosis not present

## 2017-12-13 DIAGNOSIS — I2510 Atherosclerotic heart disease of native coronary artery without angina pectoris: Secondary | ICD-10-CM | POA: Diagnosis not present

## 2017-12-13 DIAGNOSIS — N201 Calculus of ureter: Secondary | ICD-10-CM | POA: Diagnosis present

## 2017-12-13 DIAGNOSIS — I10 Essential (primary) hypertension: Secondary | ICD-10-CM | POA: Insufficient documentation

## 2017-12-13 DIAGNOSIS — N471 Phimosis: Secondary | ICD-10-CM | POA: Insufficient documentation

## 2017-12-13 DIAGNOSIS — Z79899 Other long term (current) drug therapy: Secondary | ICD-10-CM | POA: Insufficient documentation

## 2017-12-13 HISTORY — PX: HOLMIUM LASER APPLICATION: SHX5852

## 2017-12-13 HISTORY — PX: CYSTOSCOPY WITH RETROGRADE PYELOGRAM, URETEROSCOPY AND STENT PLACEMENT: SHX5789

## 2017-12-13 LAB — GLUCOSE, CAPILLARY
GLUCOSE-CAPILLARY: 146 mg/dL — AB (ref 65–99)
Glucose-Capillary: 131 mg/dL — ABNORMAL HIGH (ref 65–99)

## 2017-12-13 SURGERY — CYSTOURETEROSCOPY, WITH RETROGRADE PYELOGRAM AND STENT INSERTION
Anesthesia: General | Laterality: Right

## 2017-12-13 MED ORDER — PHENYLEPHRINE 40 MCG/ML (10ML) SYRINGE FOR IV PUSH (FOR BLOOD PRESSURE SUPPORT)
PREFILLED_SYRINGE | INTRAVENOUS | Status: DC | PRN
  Administered 2017-12-13: 80 ug via INTRAVENOUS
  Administered 2017-12-13: 120 ug via INTRAVENOUS
  Administered 2017-12-13: 80 ug via INTRAVENOUS
  Administered 2017-12-13: 120 ug via INTRAVENOUS

## 2017-12-13 MED ORDER — SODIUM CHLORIDE 0.9 % IR SOLN
Status: DC | PRN
  Administered 2017-12-13: 6000 mL

## 2017-12-13 MED ORDER — SUCCINYLCHOLINE CHLORIDE 200 MG/10ML IV SOSY
PREFILLED_SYRINGE | INTRAVENOUS | Status: AC
  Filled 2017-12-13: qty 10

## 2017-12-13 MED ORDER — PROMETHAZINE HCL 25 MG/ML IJ SOLN: 6.2500 mg | INTRAMUSCULAR | Status: DC | PRN

## 2017-12-13 MED ORDER — SUCCINYLCHOLINE CHLORIDE 200 MG/10ML IV SOSY
PREFILLED_SYRINGE | INTRAVENOUS | Status: DC | PRN
  Administered 2017-12-13: 120 mg via INTRAVENOUS

## 2017-12-13 MED ORDER — PROPOFOL 10 MG/ML IV BOLUS
INTRAVENOUS | Status: AC
  Filled 2017-12-13: qty 20

## 2017-12-13 MED ORDER — PHENYLEPHRINE 40 MCG/ML (10ML) SYRINGE FOR IV PUSH (FOR BLOOD PRESSURE SUPPORT)
PREFILLED_SYRINGE | INTRAVENOUS | Status: AC
  Filled 2017-12-13: qty 10

## 2017-12-13 MED ORDER — PHENYLEPHRINE HCL 10 MG/ML IJ SOLN
INTRAVENOUS | Status: DC | PRN
  Administered 2017-12-13: 40 ug/min via INTRAVENOUS

## 2017-12-13 MED ORDER — HYDROCODONE-ACETAMINOPHEN 5-325 MG PO TABS: 1.0000 | ORAL_TABLET | ORAL | Status: DC | PRN

## 2017-12-13 MED ORDER — PHENYLEPHRINE HCL 10 MG/ML IJ SOLN
INTRAMUSCULAR | Status: AC
  Filled 2017-12-13: qty 1

## 2017-12-13 MED ORDER — ROCURONIUM BROMIDE 10 MG/ML (PF) SYRINGE
PREFILLED_SYRINGE | INTRAVENOUS | Status: AC
  Filled 2017-12-13: qty 5

## 2017-12-13 MED ORDER — LACTATED RINGERS IV SOLN
INTRAVENOUS | Status: DC
Start: 2017-12-13 — End: 2017-12-13
  Administered 2017-12-13: 13:00:00 via INTRAVENOUS

## 2017-12-13 MED ORDER — DEXAMETHASONE SODIUM PHOSPHATE 10 MG/ML IJ SOLN
INTRAMUSCULAR | Status: DC | PRN
Start: 2017-12-13 — End: 2017-12-13
  Administered 2017-12-13: 10 mg via INTRAVENOUS

## 2017-12-13 MED ORDER — ROCURONIUM BROMIDE 10 MG/ML (PF) SYRINGE
PREFILLED_SYRINGE | INTRAVENOUS | Status: DC | PRN
  Administered 2017-12-13: 10 mg via INTRAVENOUS

## 2017-12-13 MED ORDER — LIDOCAINE 2% (20 MG/ML) 5 ML SYRINGE
INTRAMUSCULAR | Status: DC | PRN
Start: 2017-12-13 — End: 2017-12-13
  Administered 2017-12-13: 100 mg via INTRAVENOUS

## 2017-12-13 MED ORDER — ONDANSETRON HCL 4 MG/2ML IJ SOLN
INTRAMUSCULAR | Status: DC | PRN
  Administered 2017-12-13: 4 mg via INTRAVENOUS

## 2017-12-13 MED ORDER — HYDROCODONE-ACETAMINOPHEN 7.5-325 MG PO TABS: 1.0000 | ORAL_TABLET | Freq: Once | ORAL | Status: DC | PRN

## 2017-12-13 MED ORDER — DEXAMETHASONE SODIUM PHOSPHATE 10 MG/ML IJ SOLN
INTRAMUSCULAR | Status: AC
  Filled 2017-12-13: qty 1

## 2017-12-13 MED ORDER — LIDOCAINE 2% (20 MG/ML) 5 ML SYRINGE
INTRAMUSCULAR | Status: AC
  Filled 2017-12-13: qty 5

## 2017-12-13 MED ORDER — OXYBUTYNIN CHLORIDE 5 MG PO TABS: 5.0000 mg | ORAL_TABLET | Freq: Three times a day (TID) | ORAL | Status: DC | PRN

## 2017-12-13 MED ORDER — FENTANYL CITRATE (PF) 100 MCG/2ML IJ SOLN
INTRAMUSCULAR | Status: AC
  Filled 2017-12-13: qty 2

## 2017-12-13 MED ORDER — CLOTRIMAZOLE-BETAMETHASONE 1-0.05 % EX CREA: TOPICAL_CREAM | CUTANEOUS | 1 refills | Status: AC

## 2017-12-13 MED ORDER — ONDANSETRON HCL 4 MG/2ML IJ SOLN
INTRAMUSCULAR | Status: AC
  Filled 2017-12-13: qty 2

## 2017-12-13 MED ORDER — TAMSULOSIN HCL 0.4 MG PO CAPS: 0.4000 mg | ORAL_CAPSULE | Freq: Every day | ORAL | Status: DC

## 2017-12-13 MED ORDER — FENTANYL CITRATE (PF) 250 MCG/5ML IJ SOLN
INTRAMUSCULAR | Status: DC | PRN
  Administered 2017-12-13 (×2): 50 ug via INTRAVENOUS

## 2017-12-13 MED ORDER — PROPOFOL 10 MG/ML IV BOLUS
INTRAVENOUS | Status: DC | PRN
  Administered 2017-12-13: 160 mg via INTRAVENOUS

## 2017-12-13 MED ORDER — IOHEXOL 300 MG/ML  SOLN
INTRAMUSCULAR | Status: DC | PRN
  Administered 2017-12-13: 20 mL

## 2017-12-13 MED ORDER — HYDROMORPHONE HCL 1 MG/ML IJ SOLN: 0.2500 mg | INTRAMUSCULAR | Status: DC | PRN

## 2017-12-13 MED ORDER — MEPERIDINE HCL 50 MG/ML IJ SOLN: 6.2500 mg | INTRAMUSCULAR | Status: DC | PRN

## 2017-12-13 SURGICAL SUPPLY — 13 items
BAG URO CATCHER STRL LF (MISCELLANEOUS) ×3 IMPLANT
BASKET ZERO TIP NITINOL 2.4FR (BASKET) ×3 IMPLANT
CATH INTERMIT  6FR 70CM (CATHETERS) ×3 IMPLANT
CLOTH BEACON ORANGE TIMEOUT ST (SAFETY) ×3 IMPLANT
COVER FOOTSWITCH UNIV (MISCELLANEOUS) ×3 IMPLANT
FIBER LASER TRAC TIP (UROLOGICAL SUPPLIES) ×3 IMPLANT
GLOVE BIOGEL M STRL SZ7.5 (GLOVE) ×3 IMPLANT
GOWN STRL REUS W/TWL XL LVL3 (GOWN DISPOSABLE) ×3 IMPLANT
GUIDEWIRE STR DUAL SENSOR (WIRE) ×3 IMPLANT
MANIFOLD NEPTUNE II (INSTRUMENTS) ×3 IMPLANT
PACK CYSTO (CUSTOM PROCEDURE TRAY) ×3 IMPLANT
STENT CONTOUR 6FRX26X.038 (STENTS) ×3 IMPLANT
TUBING UROLOGY SET (TUBING) ×3 IMPLANT

## 2017-12-13 NOTE — H&P (Signed)
CC: I have kidney stones.  HPI: Jay Floyd is a 77 year-old male patient who is here for renal calculi.  The problem is on the right side. He first stated noticing pain on approximately 11/24/2017. This is not his first kidney stone. He has had more than 5 stones prior to getting this one. He is currently having flank pain and back pain. He denies having groin pain, nausea, vomiting, fever, and chills. He has not caught a stone in his urine strainer since his symptoms began.   He has had eswl, ureteral stent, and ureteroscopy for treatment of his stones in the past.   Patient has had right-sided flank pain for about 1 week. It became severe last night. This prompted him to go to the emergency department where he had a CT scan performed which showed multiple right distal ureteral calculi measuring between 5-7 mm stacked on each other. There was upstream moderate hydronephrosis. There are other small right renal calculi and tiny left nonobstructing renal calculi. No ureteral calculi on the left. Creatinine was 1.8 and I do not have another baseline. He does have a history of diabetes. Urinalysis does not appear infected. Pain is currently controlled. No nausea, vomiting, fever. He does have a history of atrial fibrillation. His cardiologist is Dr Gabriel Carina.     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 Patient denies hard to postpone urination, burning/ pain with urination, stream starts and stops, trouble starting your stream, have to strain to urinate , and penile pain.  Gastrointestinal (Upper):   Patient denies nausea, vomiting, and indigestion/ heartburn.  Gastrointestinal (Lower):   Patient denies diarrhea and constipation.  Constitutional:   Patient denies fever, night sweats, weight loss, and fatigue.  Skin:   Patient reports skin rash/ lesion. Patient denies itching.  Eyes:   Patient denies blurred vision and double vision.  Ears/ Nose/ Throat:   Patient denies sinus problems and sore throat.  Hematologic/Lymphatic:   Patient denies swollen glands and easy bruising.  Cardiovascular:   Patient denies leg swelling and chest pains.  Respiratory:   Patient reports shortness of breath. Patient denies cough.  Endocrine:   Patient denies excessive thirst.  Musculoskeletal:   Patient reports joint pain. Patient denies back pain.  Neurological:   Patient denies headaches and dizziness.  Psychologic:   Patient denies depression and anxiety.   VITAL SIGNS:      12/01/2017 02:13 PM  Weight 330 lb / 149.69 kg  Height 73 in / 185.42 cm  BP 160/91 mmHg  Heart Rate 90 /min  BMI 43.5 kg/m   GU PHYSICAL EXAMINATION:    Anus and Perineum: No hemorrhoids. No anal  stenosis. No rectal fissure, no anal fissure. No edema, no dimple, no perineal tenderness, no anal tenderness.  Scrotum: No lesions. No edema. No cysts. No warts.  Epididymides: Right: no spermatocele, no masses, no cysts, no tenderness, no induration, no enlargement. Left: no spermatocele, no masses, no cysts, no tenderness, no induration, no enlargement.  Testes: No tenderness, no swelling, no enlargement left testes. No tenderness, no swelling, no enlargement right testes. Normal location  left testes. Normal location right testes. No mass, no cyst, no varicocele, no hydrocele left testes. No mass, no cyst, no varicocele, no hydrocele right testes.  Urethral Meatus: Normal size. No lesion, no wart, no discharge, no polyp. Normal location.  Penis: Circumcised, no warts, no cracks. No dorsal Peyronie's plaques, no left corporal Peyronie's plaques, no right corporal Peyronie's plaques, no scarring, no warts. No balanitis, no meatal stenosis.  Prostate: 40 gram or 2+ size. Left lobe normal consistency, right lobe normal consistency. Symmetrical lobes. No prostate nodule. Left lobe no tenderness, right lobe no tenderness.  Seminal Vesicles: Nonpalpable.  Sphincter Tone: Normal sphincter. No rectal tenderness. No rectal mass.    MULTI-SYSTEM PHYSICAL EXAMINATION:    Constitutional: Well-nourished. No physical deformities. Normally developed. Good grooming.  Respiratory: No labored breathing, no use of accessory muscles.   Cardiovascular: Normal temperature, adequate perfusion of extremities  Skin: No paleness, no jaundice  Neurologic / Psychiatric: Oriented to time, oriented to place, oriented to person. No depression, no anxiety, no agitation.  Gastrointestinal: No mass, no tenderness, no rigidity, morbidly obese abdomen. No CVA tenderness bilaterally  Eyes: Normal conjunctivae. Normal eyelids.  Musculoskeletal: Normal gait and station of head and neck.     PAST DATA REVIEWED:  Source Of History:  Patient  Lab Test Review:   BMP  Records Review:   Previous Patient Records  X-Ray Review: C.T. Abdomen/Pelvis: Reviewed Films. Reviewed Report. Discussed With Patient.     PROCEDURES:         KUB - [id number]  A single view of the abdomen is obtained.  Ureteral stone seen on CT scan are not immediately evident.               Urinalysis Dipstick Dipstick Cont'd Micro  Color: Yellow Bilirubin: Neg WBC/hpf: NS (Not Seen)  Appearance: Clear Ketones: Neg RBC/hpf: 3 - 10/hpf  Specific  Gravity: 1.025 Blood: Trace Bacteria: NS (Not Seen)  pH: 5.5 Protein: 1+ Cystals: NS (Not Seen)  Glucose: Neg Urobilinogen: 0.2 Casts: NS (Not Seen)    Nitrites: Neg Trichomonas: Not Present    Leukocyte Esterase: Neg Mucous: Present      Epithelial Cells: 0 - 5/hpf      Yeast: NS (Not Seen)      Sperm: Not Present    ASSESSMENT:      ICD-10 Details  1 GU:   Ureteral calculus - N20.1    PLAN:           Orders X-Rays: KUB          Schedule         Document Letter(s):  Created for Patient: Clinical Summary         Notes:   Continue Flomax. He already has hydrocodone for pain.   He has had several ureteroscopy's in the past and he would like to proceed with this. He understands potential for bleeding, infection, anesthetic complications, injury to surrounding structures such as ureteral transection which is rare but possible.        Next Appointment:  Next Appointment: 01/07/2018 10:00 AM    Appointment Type: [place] Patient    Location: [facility] - [address]    Provider: [facility]    Reason for Visit: NP- h/o Kidney stones; pt made appt.      Signed by Link Snuffer, III, M.D. on 12/01/17 at 3:12 PM (EDT)

## 2017-12-13 NOTE — Anesthesia Preprocedure Evaluation (Addendum)
Anesthesia Evaluation  Patient identified by MRN, date of birth, ID band Patient awake    Reviewed: Allergy & Precautions, NPO status , Patient's Chart, lab work & pertinent test results, reviewed documented beta blocker date and time   History of Anesthesia Complications (+) DIFFICULT AIRWAY, POST - OP SPINAL HEADACHE and history of anesthetic complications  Airway Mallampati: I  TM Distance: >3 FB Neck ROM: Full    Dental no notable dental hx. (+) Teeth Intact, Caps   Pulmonary shortness of breath and with exertion,    Pulmonary exam normal breath sounds clear to auscultation       Cardiovascular hypertension, Pt. on medications and Pt. on home beta blockers + CAD  + dysrhythmias Atrial Fibrillation  Rhythm:Irregular Rate:Normal  Non obstructive CAD on Catheterization LVEF 55-60%   Neuro/Psych  Headaches, negative psych ROS   GI/Hepatic negative GI ROS, Neg liver ROS, (+) Hepatitis -  Endo/Other  diabetes, Well Controlled, Type 2, Oral Hypoglycemic AgentsMorbid obesityHyperlipidemia  Renal/GU Renal diseaseRight ureteral calculus  negative genitourinary   Musculoskeletal  (+) Arthritis , Osteoarthritis,    Abdominal (+) + obese,   Peds  Hematology Was on Eliquis but stopped due to subconjunctival bleeding.   Anesthesia Other Findings   Reproductive/Obstetrics                            Anesthesia Physical Anesthesia Plan  ASA: III  Anesthesia Plan: General   Post-op Pain Management:    Induction: Intravenous  PONV Risk Score and Plan: 4 or greater and Treatment may vary due to age or medical condition, Ondansetron and Dexamethasone  Airway Management Planned: Oral ETT and Video Laryngoscope Planned  Additional Equipment:   Intra-op Plan:   Post-operative Plan: Extubation in OR  Informed Consent: I have reviewed the patients History and Physical, chart, labs and discussed the  procedure including the risks, benefits and alternatives for the proposed anesthesia with the patient or authorized representative who has indicated his/her understanding and acceptance.   Dental advisory given  Plan Discussed with: CRNA, Anesthesiologist and Surgeon  Anesthesia Plan Comments:        Anesthesia Quick Evaluation

## 2017-12-13 NOTE — Anesthesia Procedure Notes (Signed)
Procedure Name: Intubation Date/Time: 12/13/2017 3:04 PM Performed by: Talbot Grumbling, CRNA Pre-anesthesia Checklist: Patient identified, Emergency Drugs available, Suction available and Patient being monitored Patient Re-evaluated:Patient Re-evaluated prior to induction Oxygen Delivery Method: Circle system utilized Preoxygenation: Pre-oxygenation with 100% oxygen Induction Type: IV induction Ventilation: Mask ventilation without difficulty Laryngoscope Size: Glidescope (Lo Pro 4) Grade View: Grade I Tube type: Oral Tube size: 8.0 mm Number of attempts: 1 Airway Equipment and Method: Stylet and Video-laryngoscopy Placement Confirmation: ETT inserted through vocal cords under direct vision,  positive ETCO2 and breath sounds checked- equal and bilateral Secured at: 23 cm Tube secured with: Tape Dental Injury: Teeth and Oropharynx as per pre-operative assessment

## 2017-12-13 NOTE — Interval H&P Note (Signed)
History and Physical Interval Note:  12/13/2017 2:30 PM  Jay Floyd  has presented today for surgery, with the diagnosis of RIGHT URETERAL STONE  The various methods of treatment have been discussed with the patient and family. After consideration of risks, benefits and other options for treatment, the patient has consented to  Procedure(s): CYSTOSCOPY WITH RIGHT  RETROGRADE PYELOGRAM, URETEROSCOPY HOLMIUM LASER AND STENT PLACEMENT (Right) HOLMIUM LASER APPLICATION (Right) as a surgical intervention .  The patient's history has been reviewed, patient examined, no change in status, stable for surgery.  I have reviewed the patient's chart and labs.  Questions were answered to the patient's satisfaction.     Marton Redwood, III

## 2017-12-13 NOTE — Anesthesia Postprocedure Evaluation (Signed)
Anesthesia Post Note  Patient: Brysyn Brandenberger  Procedure(s) Performed: CYSTOSCOPY WITH RIGHT  RETROGRADE PYELOGRAM, URETEROSCOPY AND STENT PLACEMENT (Right ) HOLMIUM LASER APPLICATION (Right )     Patient location during evaluation: PACU Anesthesia Type: General Level of consciousness: awake and alert and oriented Pain management: pain level controlled Vital Signs Assessment: post-procedure vital signs reviewed and stable Respiratory status: spontaneous breathing, nonlabored ventilation and respiratory function stable Cardiovascular status: blood pressure returned to baseline and stable Postop Assessment: no apparent nausea or vomiting Anesthetic complications: no    Last Vitals:  Vitals:   12/13/17 1615 12/13/17 1652  BP: (!) 148/96 133/64  Pulse: 77 60  Resp: 15 16  Temp:  36.7 C  SpO2: 97% 98%    Last Pain:  Vitals:   12/13/17 1557  TempSrc:   PainSc: 0-No pain                 Silver Achey A.

## 2017-12-13 NOTE — Transfer of Care (Signed)
Immediate Anesthesia Transfer of Care Note  Patient: Jay Floyd  Procedure(s) Performed: CYSTOSCOPY WITH RIGHT  RETROGRADE PYELOGRAM, URETEROSCOPY AND STENT PLACEMENT (Right ) HOLMIUM LASER APPLICATION (Right )  Patient Location: PACU  Anesthesia Type:General  Level of Consciousness: awake, alert  and oriented  Airway & Oxygen Therapy: Patient Spontanous Breathing and Patient connected to face mask oxygen  Post-op Assessment: Report given to RN and Post -op Vital signs reviewed and stable  Post vital signs: Reviewed and stable  Last Vitals:  Vitals Value Taken Time  BP    Temp    Pulse 100 12/13/2017  3:58 PM  Resp 15 12/13/2017  3:58 PM  SpO2 97 % 12/13/2017  3:58 PM  Vitals shown include unvalidated device data.  Last Pain:  Vitals:   12/13/17 1309  TempSrc: Oral         Complications: No apparent anesthesia complications

## 2017-12-13 NOTE — Op Note (Addendum)
Operative Note  Preoperative diagnosis:  1.  Right ureteral calculus  2.  Phimosis  Postoperative diagnosis: 1.  Right ureteral calculus  2.  Phimosis  Procedure(s): 1.  Cystoscopy with right retrograde pyelogram with interpretation, right ureteroscopy with laser lithotripsy, stone basketing, ureteral stent placement fluoroscopy less than 1 hour 2.  Lysis of penile adhesions  Surgeon: Link Snuffer, MD  Assistants: None  Anesthesia: General  Complications: None  EBL: Minimal  Specimens: 1.  None  Drains/Catheters: 1.  6 x 26 double-J ureteral stent  Intraoperative findings: 1.  Normal urethra and bladder 2.  Approximately 7 mm right distal ureteral calculus. 3.  Retrograde pyelogram revealed hydroureteronephrosis.  4.  Phimosis  Indication: 77 year old male with a right ureteral calculus and pain desires the above operation.  The patient also incidentally noted that he has been having pain with erections.  He has had a circumcision in the past but he noted that his foreskin has noted to become phimotic again.  He asked that this be released in the operating room.  Description of procedure:  The patient was identified and consent was obtained.  The patient was taken to the operating room and placed in the supine position.  The patient was placed under general anesthesia.  Perioperative antibiotics were administered.  The patient was placed in dorsal lithotomy.  Patient was prepped and draped in a standard sterile fashion and a timeout was performed.  I first bluntly retracted foreskin and lysed penile adhesions and bluntly opened his phimotic skin.  The foreskin was nice and open at this point.  I do not believe that he is a good candidate for circumcision as there is not enough penile shaft skin remaining to excise any skin.  The retraction is secondary to his large pannus and just retraction of the penis and his shaft skin has become the phimotic skin.    A 21 French rigid  cystoscope was advanced into the urethra and into the bladder.  Complete cystoscopy was performed with no abnormal findings.  The right ureter was cannulated with a sensor wire which was advanced up to the kidney under fluoroscopic guidance.  A semirigid ureteroscope was advanced alongside the wire up to the stone of interest which was fragmented to smaller fragments.  A basket was used to extract larger fragments atraumatically which were placed into the bladder.  I then inspected the entire ureter up to the level of the renal pelvis and no other ureteral calculi were seen.  Shot a retrograde pyelogram through the scope.  I then withdrew the scope visualizing the entire ureter upon removal.  There was some ureteral edema around the level stone impaction but there was no significant stone fragments and there was no ureteral injury.  I backloaded the wire onto the cystoscope which was advanced into the bladder.  I then placed a 6 x 26 double-J ureteral stent followed by removal of the wire.  Fluoroscopy confirmed proximal placement direct visualization confirmed a good coil within the bladder.  Drained the bladder and withdrew the scope and this concluded the operation.  The patient tolerated procedure well and was stable postoperatively.  Plan: Return in about 1 week for stent removal.  KUB and renal ultrasound in 6 weeks.

## 2017-12-13 NOTE — Discharge Instructions (Signed)

## 2017-12-21 DIAGNOSIS — N200 Calculus of kidney: Secondary | ICD-10-CM | POA: Diagnosis not present

## 2017-12-24 DIAGNOSIS — E1140 Type 2 diabetes mellitus with diabetic neuropathy, unspecified: Secondary | ICD-10-CM | POA: Diagnosis not present

## 2017-12-24 DIAGNOSIS — E1151 Type 2 diabetes mellitus with diabetic peripheral angiopathy without gangrene: Secondary | ICD-10-CM | POA: Diagnosis not present

## 2018-01-31 DIAGNOSIS — I10 Essential (primary) hypertension: Secondary | ICD-10-CM | POA: Diagnosis not present

## 2018-01-31 DIAGNOSIS — I2510 Atherosclerotic heart disease of native coronary artery without angina pectoris: Secondary | ICD-10-CM | POA: Diagnosis not present

## 2018-01-31 DIAGNOSIS — I480 Paroxysmal atrial fibrillation: Secondary | ICD-10-CM | POA: Diagnosis not present

## 2018-02-09 DIAGNOSIS — N200 Calculus of kidney: Secondary | ICD-10-CM | POA: Diagnosis not present

## 2018-03-03 ENCOUNTER — Other Ambulatory Visit: Payer: Self-pay

## 2018-03-04 DIAGNOSIS — E782 Mixed hyperlipidemia: Secondary | ICD-10-CM | POA: Diagnosis not present

## 2018-03-04 DIAGNOSIS — I10 Essential (primary) hypertension: Secondary | ICD-10-CM | POA: Diagnosis not present

## 2018-03-04 DIAGNOSIS — E1165 Type 2 diabetes mellitus with hyperglycemia: Secondary | ICD-10-CM | POA: Diagnosis not present

## 2018-03-04 DIAGNOSIS — I482 Chronic atrial fibrillation: Secondary | ICD-10-CM | POA: Diagnosis not present

## 2018-03-04 DIAGNOSIS — E7801 Familial hypercholesterolemia: Secondary | ICD-10-CM | POA: Diagnosis not present

## 2018-03-04 DIAGNOSIS — I480 Paroxysmal atrial fibrillation: Secondary | ICD-10-CM | POA: Diagnosis not present

## 2018-03-07 DIAGNOSIS — N200 Calculus of kidney: Secondary | ICD-10-CM | POA: Diagnosis not present

## 2018-03-07 DIAGNOSIS — E782 Mixed hyperlipidemia: Secondary | ICD-10-CM | POA: Diagnosis not present

## 2018-03-07 DIAGNOSIS — E1165 Type 2 diabetes mellitus with hyperglycemia: Secondary | ICD-10-CM | POA: Diagnosis not present

## 2018-03-07 DIAGNOSIS — Z6841 Body Mass Index (BMI) 40.0 and over, adult: Secondary | ICD-10-CM | POA: Diagnosis not present

## 2018-03-07 DIAGNOSIS — I480 Paroxysmal atrial fibrillation: Secondary | ICD-10-CM | POA: Diagnosis not present

## 2018-03-07 DIAGNOSIS — I10 Essential (primary) hypertension: Secondary | ICD-10-CM | POA: Diagnosis not present

## 2018-03-10 DIAGNOSIS — Z713 Dietary counseling and surveillance: Secondary | ICD-10-CM | POA: Diagnosis not present

## 2018-03-10 DIAGNOSIS — M170 Bilateral primary osteoarthritis of knee: Secondary | ICD-10-CM | POA: Diagnosis not present

## 2018-03-14 DIAGNOSIS — E1140 Type 2 diabetes mellitus with diabetic neuropathy, unspecified: Secondary | ICD-10-CM | POA: Diagnosis not present

## 2018-03-14 DIAGNOSIS — E1151 Type 2 diabetes mellitus with diabetic peripheral angiopathy without gangrene: Secondary | ICD-10-CM | POA: Diagnosis not present

## 2018-03-23 DIAGNOSIS — I10 Essential (primary) hypertension: Secondary | ICD-10-CM | POA: Diagnosis not present

## 2018-03-23 DIAGNOSIS — I480 Paroxysmal atrial fibrillation: Secondary | ICD-10-CM | POA: Diagnosis not present

## 2018-06-13 DIAGNOSIS — E1140 Type 2 diabetes mellitus with diabetic neuropathy, unspecified: Secondary | ICD-10-CM | POA: Diagnosis not present

## 2018-06-13 DIAGNOSIS — E1151 Type 2 diabetes mellitus with diabetic peripheral angiopathy without gangrene: Secondary | ICD-10-CM | POA: Diagnosis not present

## 2018-07-06 DIAGNOSIS — E1165 Type 2 diabetes mellitus with hyperglycemia: Secondary | ICD-10-CM | POA: Diagnosis not present

## 2018-07-06 DIAGNOSIS — E7800 Pure hypercholesterolemia, unspecified: Secondary | ICD-10-CM | POA: Diagnosis not present

## 2018-07-06 DIAGNOSIS — I480 Paroxysmal atrial fibrillation: Secondary | ICD-10-CM | POA: Diagnosis not present

## 2018-07-06 DIAGNOSIS — I10 Essential (primary) hypertension: Secondary | ICD-10-CM | POA: Diagnosis not present

## 2018-07-06 DIAGNOSIS — E782 Mixed hyperlipidemia: Secondary | ICD-10-CM | POA: Diagnosis not present

## 2018-07-08 DIAGNOSIS — Z23 Encounter for immunization: Secondary | ICD-10-CM | POA: Diagnosis not present

## 2018-07-08 DIAGNOSIS — I10 Essential (primary) hypertension: Secondary | ICD-10-CM | POA: Diagnosis not present

## 2018-07-08 DIAGNOSIS — Z6841 Body Mass Index (BMI) 40.0 and over, adult: Secondary | ICD-10-CM | POA: Diagnosis not present

## 2018-07-08 DIAGNOSIS — Z0000 Encounter for general adult medical examination without abnormal findings: Secondary | ICD-10-CM | POA: Diagnosis not present

## 2018-08-29 DIAGNOSIS — E1151 Type 2 diabetes mellitus with diabetic peripheral angiopathy without gangrene: Secondary | ICD-10-CM | POA: Diagnosis not present

## 2018-08-29 DIAGNOSIS — E1140 Type 2 diabetes mellitus with diabetic neuropathy, unspecified: Secondary | ICD-10-CM | POA: Diagnosis not present

## 2018-09-19 DIAGNOSIS — H2513 Age-related nuclear cataract, bilateral: Secondary | ICD-10-CM | POA: Diagnosis not present

## 2018-09-19 DIAGNOSIS — E119 Type 2 diabetes mellitus without complications: Secondary | ICD-10-CM | POA: Diagnosis not present

## 2018-10-06 DIAGNOSIS — Z713 Dietary counseling and surveillance: Secondary | ICD-10-CM | POA: Diagnosis not present

## 2018-10-07 DIAGNOSIS — I4811 Longstanding persistent atrial fibrillation: Secondary | ICD-10-CM | POA: Diagnosis not present

## 2018-11-01 DIAGNOSIS — E7800 Pure hypercholesterolemia, unspecified: Secondary | ICD-10-CM | POA: Diagnosis not present

## 2018-11-01 DIAGNOSIS — I10 Essential (primary) hypertension: Secondary | ICD-10-CM | POA: Diagnosis not present

## 2018-11-01 DIAGNOSIS — E782 Mixed hyperlipidemia: Secondary | ICD-10-CM | POA: Diagnosis not present

## 2018-11-01 DIAGNOSIS — E1165 Type 2 diabetes mellitus with hyperglycemia: Secondary | ICD-10-CM | POA: Diagnosis not present

## 2018-11-02 DIAGNOSIS — N200 Calculus of kidney: Secondary | ICD-10-CM | POA: Diagnosis not present

## 2018-11-02 DIAGNOSIS — I480 Paroxysmal atrial fibrillation: Secondary | ICD-10-CM | POA: Diagnosis not present

## 2018-11-02 DIAGNOSIS — I10 Essential (primary) hypertension: Secondary | ICD-10-CM | POA: Diagnosis not present

## 2018-11-02 DIAGNOSIS — E782 Mixed hyperlipidemia: Secondary | ICD-10-CM | POA: Diagnosis not present

## 2018-11-02 DIAGNOSIS — E1165 Type 2 diabetes mellitus with hyperglycemia: Secondary | ICD-10-CM | POA: Diagnosis not present

## 2018-11-14 DIAGNOSIS — E1151 Type 2 diabetes mellitus with diabetic peripheral angiopathy without gangrene: Secondary | ICD-10-CM | POA: Diagnosis not present

## 2018-11-14 DIAGNOSIS — E1140 Type 2 diabetes mellitus with diabetic neuropathy, unspecified: Secondary | ICD-10-CM | POA: Diagnosis not present

## 2019-02-13 DIAGNOSIS — E1140 Type 2 diabetes mellitus with diabetic neuropathy, unspecified: Secondary | ICD-10-CM | POA: Diagnosis not present

## 2019-02-13 DIAGNOSIS — E1151 Type 2 diabetes mellitus with diabetic peripheral angiopathy without gangrene: Secondary | ICD-10-CM | POA: Diagnosis not present

## 2019-02-20 DIAGNOSIS — H9190 Unspecified hearing loss, unspecified ear: Secondary | ICD-10-CM | POA: Diagnosis not present

## 2019-02-20 DIAGNOSIS — Z6841 Body Mass Index (BMI) 40.0 and over, adult: Secondary | ICD-10-CM | POA: Diagnosis not present

## 2019-02-20 DIAGNOSIS — H6120 Impacted cerumen, unspecified ear: Secondary | ICD-10-CM | POA: Diagnosis not present

## 2019-02-20 DIAGNOSIS — I10 Essential (primary) hypertension: Secondary | ICD-10-CM | POA: Diagnosis not present

## 2019-02-20 DIAGNOSIS — E1165 Type 2 diabetes mellitus with hyperglycemia: Secondary | ICD-10-CM | POA: Diagnosis not present

## 2019-03-03 DIAGNOSIS — E782 Mixed hyperlipidemia: Secondary | ICD-10-CM | POA: Diagnosis not present

## 2019-03-03 DIAGNOSIS — I10 Essential (primary) hypertension: Secondary | ICD-10-CM | POA: Diagnosis not present

## 2019-03-06 DIAGNOSIS — E782 Mixed hyperlipidemia: Secondary | ICD-10-CM | POA: Diagnosis not present

## 2019-03-06 DIAGNOSIS — I480 Paroxysmal atrial fibrillation: Secondary | ICD-10-CM | POA: Diagnosis not present

## 2019-03-06 DIAGNOSIS — E1142 Type 2 diabetes mellitus with diabetic polyneuropathy: Secondary | ICD-10-CM | POA: Diagnosis not present

## 2019-03-06 DIAGNOSIS — E1165 Type 2 diabetes mellitus with hyperglycemia: Secondary | ICD-10-CM | POA: Diagnosis not present

## 2019-03-06 DIAGNOSIS — Z6841 Body Mass Index (BMI) 40.0 and over, adult: Secondary | ICD-10-CM | POA: Diagnosis not present

## 2019-03-06 DIAGNOSIS — I10 Essential (primary) hypertension: Secondary | ICD-10-CM | POA: Diagnosis not present

## 2019-03-06 DIAGNOSIS — N200 Calculus of kidney: Secondary | ICD-10-CM | POA: Diagnosis not present

## 2019-04-03 DIAGNOSIS — I10 Essential (primary) hypertension: Secondary | ICD-10-CM | POA: Diagnosis not present

## 2019-04-03 DIAGNOSIS — E1165 Type 2 diabetes mellitus with hyperglycemia: Secondary | ICD-10-CM | POA: Diagnosis not present

## 2019-04-03 DIAGNOSIS — E782 Mixed hyperlipidemia: Secondary | ICD-10-CM | POA: Diagnosis not present

## 2019-04-24 DIAGNOSIS — E1151 Type 2 diabetes mellitus with diabetic peripheral angiopathy without gangrene: Secondary | ICD-10-CM | POA: Diagnosis not present

## 2019-04-24 DIAGNOSIS — E1140 Type 2 diabetes mellitus with diabetic neuropathy, unspecified: Secondary | ICD-10-CM | POA: Diagnosis not present

## 2019-06-02 DIAGNOSIS — E1165 Type 2 diabetes mellitus with hyperglycemia: Secondary | ICD-10-CM | POA: Diagnosis not present

## 2019-06-02 DIAGNOSIS — I10 Essential (primary) hypertension: Secondary | ICD-10-CM | POA: Diagnosis not present

## 2019-06-28 DIAGNOSIS — E119 Type 2 diabetes mellitus without complications: Secondary | ICD-10-CM | POA: Diagnosis not present

## 2019-06-28 DIAGNOSIS — Z87442 Personal history of urinary calculi: Secondary | ICD-10-CM | POA: Diagnosis not present

## 2019-06-28 DIAGNOSIS — N390 Urinary tract infection, site not specified: Secondary | ICD-10-CM | POA: Diagnosis not present

## 2019-06-28 DIAGNOSIS — R309 Painful micturition, unspecified: Secondary | ICD-10-CM | POA: Diagnosis not present

## 2019-06-28 DIAGNOSIS — R350 Frequency of micturition: Secondary | ICD-10-CM | POA: Diagnosis not present

## 2019-07-03 DIAGNOSIS — E1129 Type 2 diabetes mellitus with other diabetic kidney complication: Secondary | ICD-10-CM | POA: Diagnosis not present

## 2019-07-03 DIAGNOSIS — I259 Chronic ischemic heart disease, unspecified: Secondary | ICD-10-CM | POA: Diagnosis not present

## 2019-07-03 DIAGNOSIS — E7800 Pure hypercholesterolemia, unspecified: Secondary | ICD-10-CM | POA: Diagnosis not present

## 2019-07-03 DIAGNOSIS — E876 Hypokalemia: Secondary | ICD-10-CM | POA: Diagnosis not present

## 2019-07-03 DIAGNOSIS — E1122 Type 2 diabetes mellitus with diabetic chronic kidney disease: Secondary | ICD-10-CM | POA: Diagnosis not present

## 2019-07-03 DIAGNOSIS — E1142 Type 2 diabetes mellitus with diabetic polyneuropathy: Secondary | ICD-10-CM | POA: Diagnosis not present

## 2019-07-03 DIAGNOSIS — K219 Gastro-esophageal reflux disease without esophagitis: Secondary | ICD-10-CM | POA: Diagnosis not present

## 2019-07-03 DIAGNOSIS — E039 Hypothyroidism, unspecified: Secondary | ICD-10-CM | POA: Diagnosis not present

## 2019-07-03 DIAGNOSIS — E1121 Type 2 diabetes mellitus with diabetic nephropathy: Secondary | ICD-10-CM | POA: Diagnosis not present

## 2019-07-05 DIAGNOSIS — I10 Essential (primary) hypertension: Secondary | ICD-10-CM | POA: Diagnosis not present

## 2019-07-05 DIAGNOSIS — I480 Paroxysmal atrial fibrillation: Secondary | ICD-10-CM | POA: Diagnosis not present

## 2019-07-05 DIAGNOSIS — E1165 Type 2 diabetes mellitus with hyperglycemia: Secondary | ICD-10-CM | POA: Diagnosis not present

## 2019-07-05 DIAGNOSIS — E782 Mixed hyperlipidemia: Secondary | ICD-10-CM | POA: Diagnosis not present

## 2019-07-05 DIAGNOSIS — Z6841 Body Mass Index (BMI) 40.0 and over, adult: Secondary | ICD-10-CM | POA: Diagnosis not present

## 2019-07-05 DIAGNOSIS — N200 Calculus of kidney: Secondary | ICD-10-CM | POA: Diagnosis not present

## 2019-07-05 DIAGNOSIS — E1142 Type 2 diabetes mellitus with diabetic polyneuropathy: Secondary | ICD-10-CM | POA: Diagnosis not present

## 2019-07-05 DIAGNOSIS — Z0001 Encounter for general adult medical examination with abnormal findings: Secondary | ICD-10-CM | POA: Diagnosis not present

## 2019-07-05 DIAGNOSIS — Z23 Encounter for immunization: Secondary | ICD-10-CM | POA: Diagnosis not present

## 2019-07-10 DIAGNOSIS — E1140 Type 2 diabetes mellitus with diabetic neuropathy, unspecified: Secondary | ICD-10-CM | POA: Diagnosis not present

## 2019-07-10 DIAGNOSIS — E1151 Type 2 diabetes mellitus with diabetic peripheral angiopathy without gangrene: Secondary | ICD-10-CM | POA: Diagnosis not present

## 2019-08-03 DIAGNOSIS — E876 Hypokalemia: Secondary | ICD-10-CM | POA: Diagnosis not present

## 2019-08-03 DIAGNOSIS — E782 Mixed hyperlipidemia: Secondary | ICD-10-CM | POA: Diagnosis not present

## 2019-08-27 IMAGING — CT CT RENAL STONE PROTOCOL
2 of 7 series · 15 of 46 positions shown, 17 images · non-contrast
Comparison: None.

CLINICAL DATA: Right flank pain and hematuria

EXAM:
CT ABDOMEN AND PELVIS WITHOUT CONTRAST
TECHNIQUE: Multidetector CT imaging of the abdomen and pelvis was performed
following the standard protocol without oral or IV contrast.

[Series 2: axial st · axial · 0.90mm/px · z∈[-539,-94]mm · 12 of 101 slices shown, 14 images]
[im 6/101  soft-tissue]
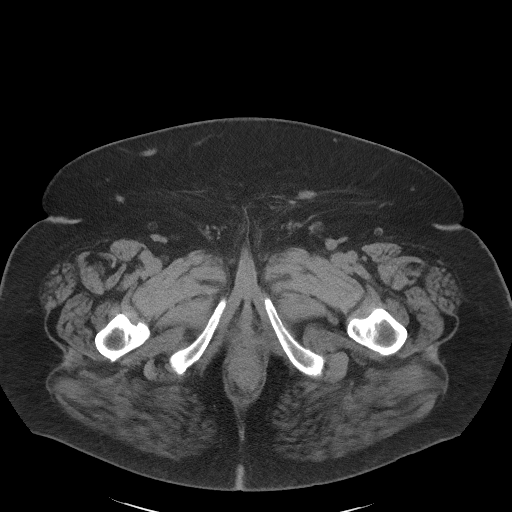
[im 6/101  bone]
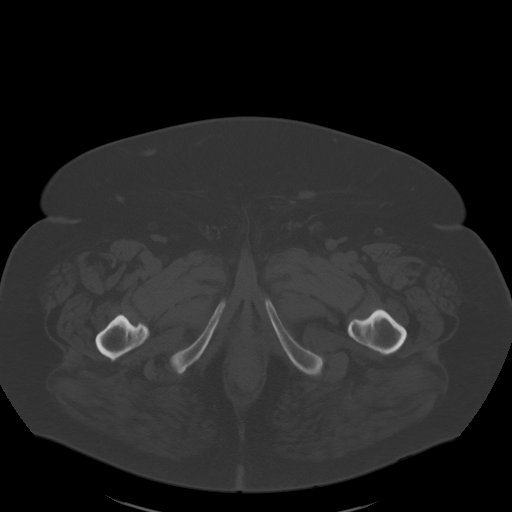
[im 16/101  soft-tissue]
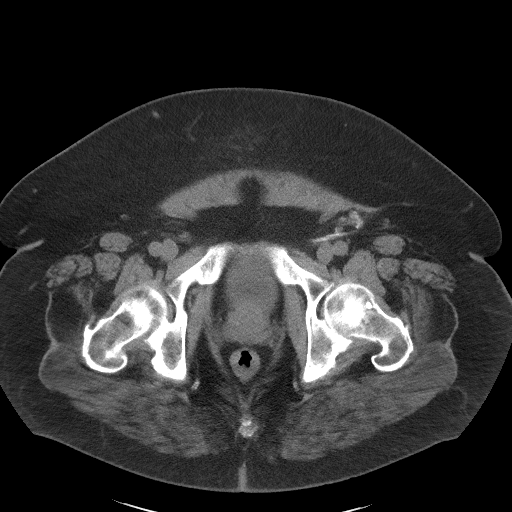
[im 22/101  soft-tissue]
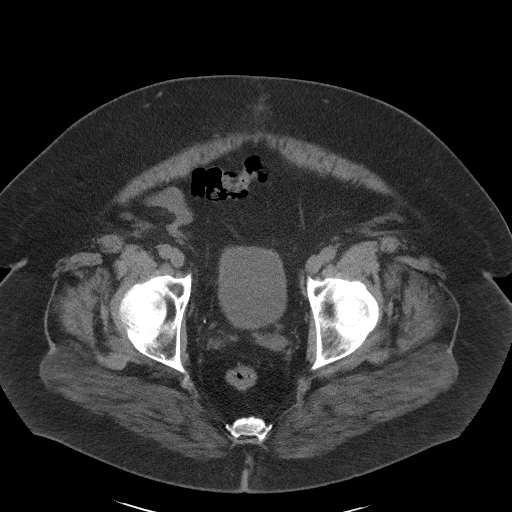
[im 32/101  soft-tissue]
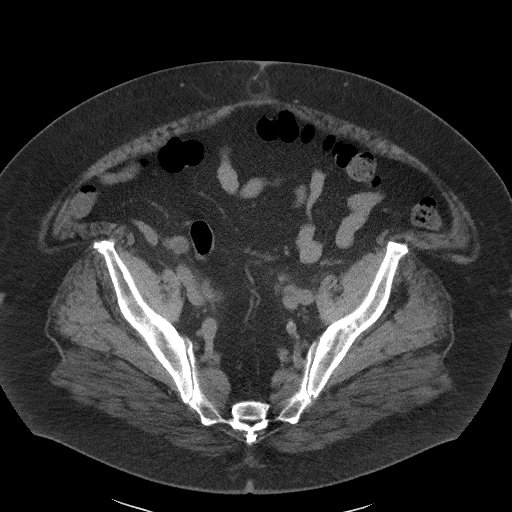
[im 37/101  soft-tissue]
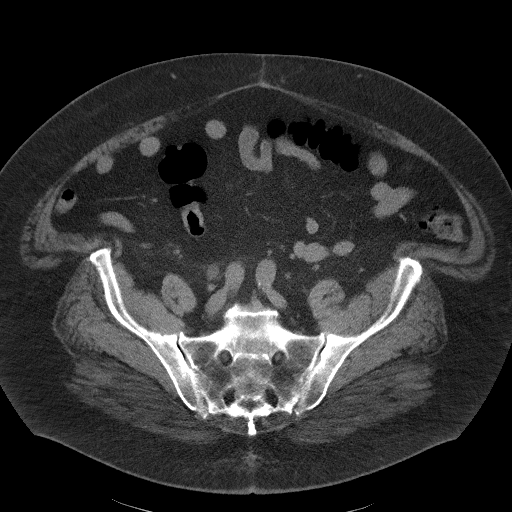
[im 48/101  soft-tissue]
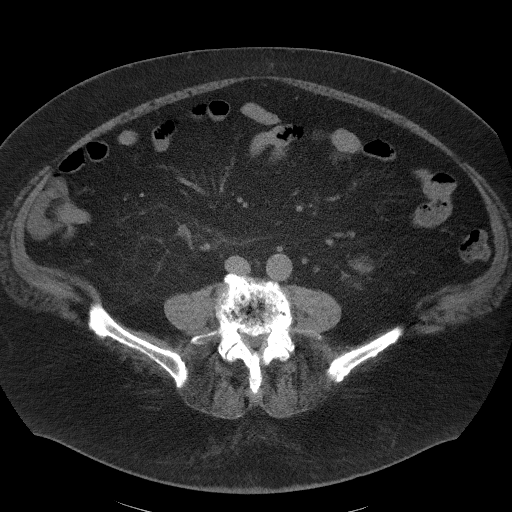
[im 53/101  soft-tissue]
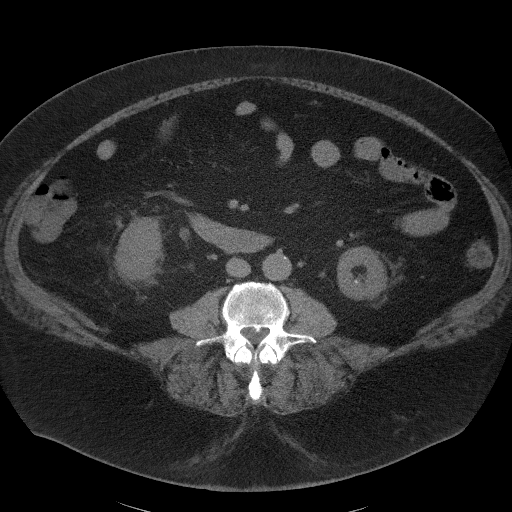
[im 64/101  soft-tissue]
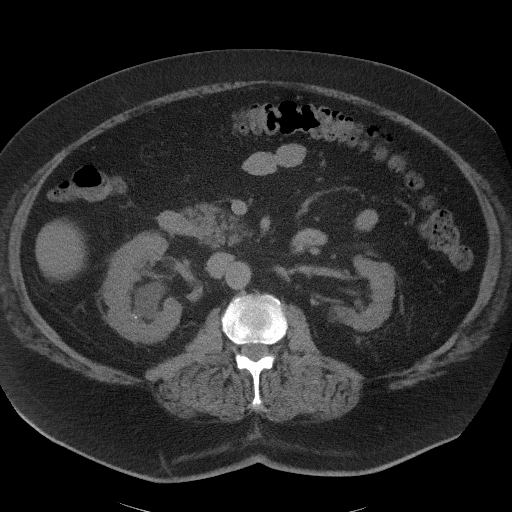
[im 69/101  soft-tissue]
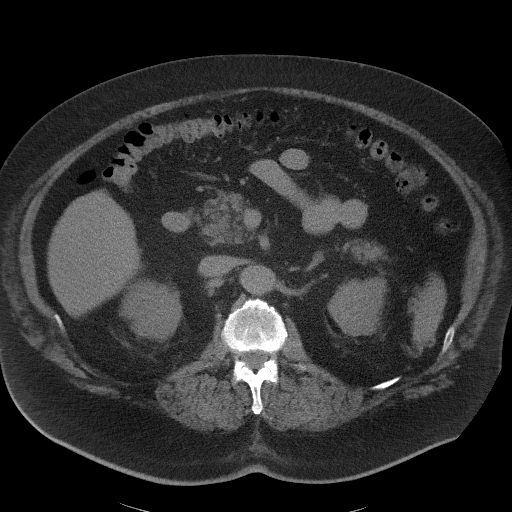
[im 69/101  bone]
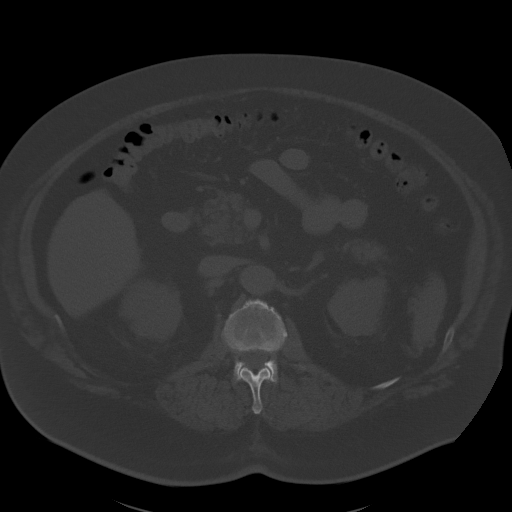
[im 79/101  soft-tissue]
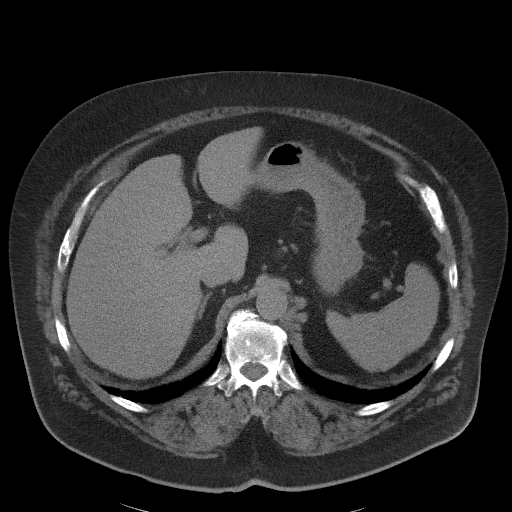
[im 85/101  soft-tissue]
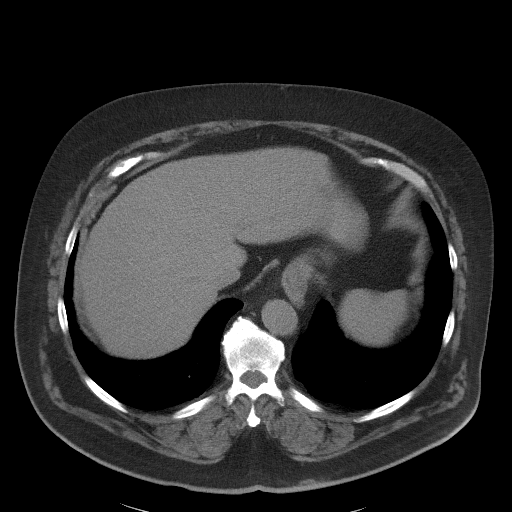
[im 95/101  soft-tissue]
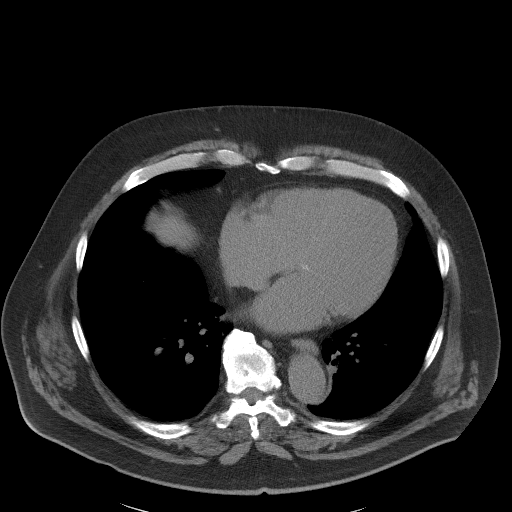

[Series 5: coronal st · coronal · 0.98mm/px · 3 of 114 slices shown]
[im 29/114  soft-tissue]
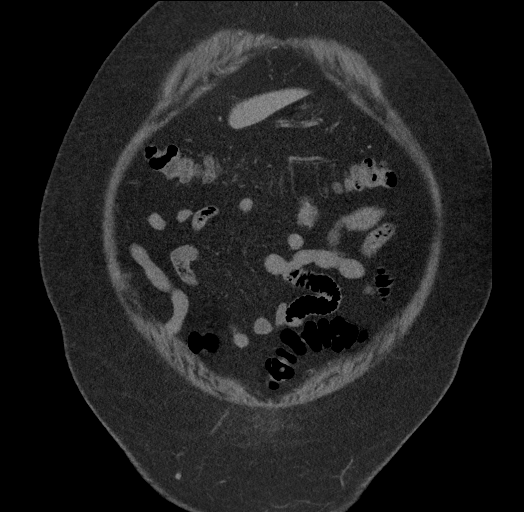
[im 57/114  soft-tissue]
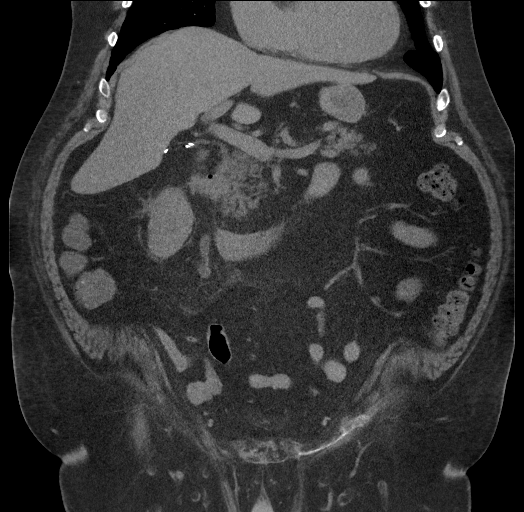
[im 85/114  soft-tissue]
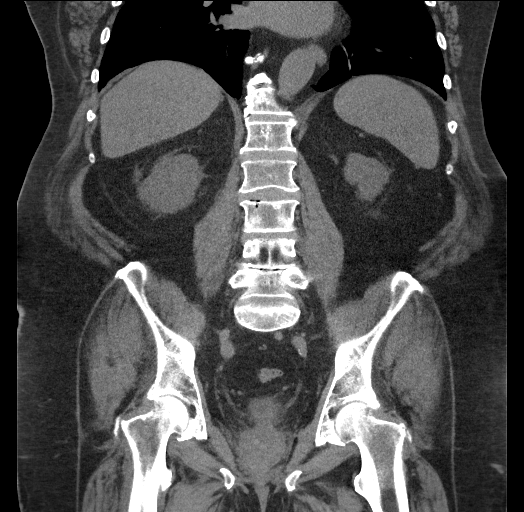

[15 of 46 positions shown; findings below may reference images not displayed]

FINDINGS: Lower chest: There is scarring in the posterior left lung base.
There is no lung base edema or consolidation. There are foci of
coronary artery calcification.

Hepatobiliary: No focal liver lesions are evident on this
noncontrast enhanced study. Gallbladder is absent. There is no
appreciable biliary duct dilatation.

Pancreas: There is no evident pancreatic mass or inflammatory focus.

Spleen: No splenic lesions are evident.

Adrenals/Urinary Tract: Right adrenal appears normal. There is a
left adrenal adenoma measuring 1.5 x 1.2 cm.

There is mild perinephric stranding surrounding both kidneys, more
on the right than on the left. No renal masses evident on either
side. There is moderate hydronephrosis on the right. There is no
appreciable hydronephrosis on the left. There are two, 1 mm calculi
in the posterior mid right kidney. There is a 1 mm calculus with a 4
x 3 mm calculus slightly more superior in the posterior mid right
kidney. Somewhat more inferiorly on the right, there is a 3 x 2 mm
calculus. On the left, there are scattered 1 mm calculi in the upper
to mid kidney.

There are calculi in the right ureter at the mid sacral level.
Specifically, there is a 5 x 4 mm calculus within immediately
adjacent 6 x 4 mm calculus in the ureter at this site. There is a
probable 1 mm calculus between these larger calculi as well. Note
that there is dilatation of the right ureter and stranding along the
course of the right ureter proximal to these calculi. The distal
right ureter is of normal contour. No ureteral calculi are seen on
the left. Urinary bladder is midline with wall thickness within
normal limits.

Stomach/Bowel: There is no appreciable bowel wall or mesenteric
thickening. No evident bowel obstruction. No free air or portal
venous air.

Vascular/Lymphatic: There is atherosclerotic calcification in the
aorta. Aorta is tortuous, but no aneurysm is evident. Major
mesenteric arterial vessels appear patent on this noncontrast
enhanced study. There is no appreciable adenopathy in the abdomen or
pelvis.

Reproductive: Prostate and seminal vesicles are normal in size and
contour. No pelvic mass evident.

Other: Appendix appears normal. No abscess or ascites evident in the
abdomen or pelvis. There is a small ventral hernia containing only
fat. There is mild scarring in the anterior abdominal wall in the
periumbilical region, likely of postoperative etiology.

Musculoskeletal: There is degenerative change in the lower thoracic
and lumbar spine regions. There are no blastic or lytic bone
lesions. There is incomplete visualization of a lipoma in the right
rectus femorals muscle measuring 3.9 x 2.6 cm. Visualized muscular
structures otherwise appear normal.
IMPRESSION: 1. There are calculi in the distal right ureter measuring 5.4 and
6.4 mm respectively which are immediately adjacent to each other in
the right ureter with moderate hydronephrosis and ureterectasis
proximal to these calculi. There are intrarenal calculi bilaterally,
larger and more numerous on the right than on the left. There is
perinephric stranding bilaterally, more on the right than on the
left. No left-sided ureteral calculus.

2.  No bowel obstruction.  No abscess.  Appendix appears normal.

3. Aortic atherosclerosis. No aneurysm. There also foci coronary
artery calcification.

4. Small left adrenal adenoma, a benign finding.

5.  Gallbladder absent.

6. Small ventral hernia containing only fat. Scarring in the
anterior abdominal wall, likely of postoperative etiology.

7.  Lipoma in right rectus femorals muscle.

Aortic Atherosclerosis ([id number]-17D.D).

## 2019-09-01 DIAGNOSIS — I10 Essential (primary) hypertension: Secondary | ICD-10-CM | POA: Diagnosis not present

## 2019-09-01 DIAGNOSIS — E7849 Other hyperlipidemia: Secondary | ICD-10-CM | POA: Diagnosis not present

## 2019-09-25 DIAGNOSIS — E1151 Type 2 diabetes mellitus with diabetic peripheral angiopathy without gangrene: Secondary | ICD-10-CM | POA: Diagnosis not present

## 2019-09-25 DIAGNOSIS — E1140 Type 2 diabetes mellitus with diabetic neuropathy, unspecified: Secondary | ICD-10-CM | POA: Diagnosis not present

## 2019-09-29 DIAGNOSIS — I259 Chronic ischemic heart disease, unspecified: Secondary | ICD-10-CM | POA: Diagnosis not present

## 2019-09-29 DIAGNOSIS — H2513 Age-related nuclear cataract, bilateral: Secondary | ICD-10-CM | POA: Diagnosis not present

## 2019-09-29 DIAGNOSIS — E7849 Other hyperlipidemia: Secondary | ICD-10-CM | POA: Diagnosis not present

## 2019-10-11 DIAGNOSIS — M170 Bilateral primary osteoarthritis of knee: Secondary | ICD-10-CM | POA: Diagnosis not present

## 2019-10-11 DIAGNOSIS — M1712 Unilateral primary osteoarthritis, left knee: Secondary | ICD-10-CM | POA: Diagnosis not present

## 2019-10-11 DIAGNOSIS — M1711 Unilateral primary osteoarthritis, right knee: Secondary | ICD-10-CM | POA: Diagnosis not present

## 2019-10-18 DIAGNOSIS — M170 Bilateral primary osteoarthritis of knee: Secondary | ICD-10-CM | POA: Diagnosis not present

## 2019-10-25 DIAGNOSIS — M170 Bilateral primary osteoarthritis of knee: Secondary | ICD-10-CM | POA: Diagnosis not present

## 2019-10-30 DIAGNOSIS — E782 Mixed hyperlipidemia: Secondary | ICD-10-CM | POA: Diagnosis not present

## 2019-10-30 DIAGNOSIS — E1122 Type 2 diabetes mellitus with diabetic chronic kidney disease: Secondary | ICD-10-CM | POA: Diagnosis not present

## 2019-10-30 DIAGNOSIS — I10 Essential (primary) hypertension: Secondary | ICD-10-CM | POA: Diagnosis not present

## 2019-10-30 DIAGNOSIS — K219 Gastro-esophageal reflux disease without esophagitis: Secondary | ICD-10-CM | POA: Diagnosis not present

## 2019-11-02 DIAGNOSIS — E1142 Type 2 diabetes mellitus with diabetic polyneuropathy: Secondary | ICD-10-CM | POA: Diagnosis not present

## 2019-11-02 DIAGNOSIS — I480 Paroxysmal atrial fibrillation: Secondary | ICD-10-CM | POA: Diagnosis not present

## 2019-11-02 DIAGNOSIS — E782 Mixed hyperlipidemia: Secondary | ICD-10-CM | POA: Diagnosis not present

## 2019-11-02 DIAGNOSIS — N200 Calculus of kidney: Secondary | ICD-10-CM | POA: Diagnosis not present

## 2019-11-02 DIAGNOSIS — Z6841 Body Mass Index (BMI) 40.0 and over, adult: Secondary | ICD-10-CM | POA: Diagnosis not present

## 2019-11-02 DIAGNOSIS — I10 Essential (primary) hypertension: Secondary | ICD-10-CM | POA: Diagnosis not present

## 2019-11-02 DIAGNOSIS — E1165 Type 2 diabetes mellitus with hyperglycemia: Secondary | ICD-10-CM | POA: Diagnosis not present

## 2019-11-06 DIAGNOSIS — I10 Essential (primary) hypertension: Secondary | ICD-10-CM | POA: Diagnosis not present

## 2019-11-06 DIAGNOSIS — I4821 Permanent atrial fibrillation: Secondary | ICD-10-CM | POA: Diagnosis not present

## 2019-11-13 DIAGNOSIS — Z23 Encounter for immunization: Secondary | ICD-10-CM | POA: Diagnosis not present

## 2019-12-04 DIAGNOSIS — Z23 Encounter for immunization: Secondary | ICD-10-CM | POA: Diagnosis not present

## 2019-12-11 DIAGNOSIS — E1151 Type 2 diabetes mellitus with diabetic peripheral angiopathy without gangrene: Secondary | ICD-10-CM | POA: Diagnosis not present

## 2019-12-11 DIAGNOSIS — E1140 Type 2 diabetes mellitus with diabetic neuropathy, unspecified: Secondary | ICD-10-CM | POA: Diagnosis not present

## 2020-02-26 DIAGNOSIS — E876 Hypokalemia: Secondary | ICD-10-CM | POA: Diagnosis not present

## 2020-02-26 DIAGNOSIS — E1122 Type 2 diabetes mellitus with diabetic chronic kidney disease: Secondary | ICD-10-CM | POA: Diagnosis not present

## 2020-02-26 DIAGNOSIS — E1151 Type 2 diabetes mellitus with diabetic peripheral angiopathy without gangrene: Secondary | ICD-10-CM | POA: Diagnosis not present

## 2020-02-26 DIAGNOSIS — E782 Mixed hyperlipidemia: Secondary | ICD-10-CM | POA: Diagnosis not present

## 2020-02-26 DIAGNOSIS — K219 Gastro-esophageal reflux disease without esophagitis: Secondary | ICD-10-CM | POA: Diagnosis not present

## 2020-02-26 DIAGNOSIS — I10 Essential (primary) hypertension: Secondary | ICD-10-CM | POA: Diagnosis not present

## 2020-02-26 DIAGNOSIS — E1140 Type 2 diabetes mellitus with diabetic neuropathy, unspecified: Secondary | ICD-10-CM | POA: Diagnosis not present

## 2020-03-01 DIAGNOSIS — E1142 Type 2 diabetes mellitus with diabetic polyneuropathy: Secondary | ICD-10-CM | POA: Diagnosis not present

## 2020-03-01 DIAGNOSIS — I480 Paroxysmal atrial fibrillation: Secondary | ICD-10-CM | POA: Diagnosis not present

## 2020-03-01 DIAGNOSIS — Z23 Encounter for immunization: Secondary | ICD-10-CM | POA: Diagnosis not present

## 2020-03-01 DIAGNOSIS — E782 Mixed hyperlipidemia: Secondary | ICD-10-CM | POA: Diagnosis not present

## 2020-03-01 DIAGNOSIS — N200 Calculus of kidney: Secondary | ICD-10-CM | POA: Diagnosis not present

## 2020-03-01 DIAGNOSIS — E1165 Type 2 diabetes mellitus with hyperglycemia: Secondary | ICD-10-CM | POA: Diagnosis not present

## 2020-03-01 DIAGNOSIS — I10 Essential (primary) hypertension: Secondary | ICD-10-CM | POA: Diagnosis not present

## 2020-03-01 DIAGNOSIS — Z6841 Body Mass Index (BMI) 40.0 and over, adult: Secondary | ICD-10-CM | POA: Diagnosis not present

## 2020-04-02 DIAGNOSIS — Z7984 Long term (current) use of oral hypoglycemic drugs: Secondary | ICD-10-CM | POA: Diagnosis not present

## 2020-04-02 DIAGNOSIS — E1165 Type 2 diabetes mellitus with hyperglycemia: Secondary | ICD-10-CM | POA: Diagnosis not present

## 2020-04-02 DIAGNOSIS — I10 Essential (primary) hypertension: Secondary | ICD-10-CM | POA: Diagnosis not present

## 2020-04-02 DIAGNOSIS — E7849 Other hyperlipidemia: Secondary | ICD-10-CM | POA: Diagnosis not present

## 2020-05-02 DIAGNOSIS — E7849 Other hyperlipidemia: Secondary | ICD-10-CM | POA: Diagnosis not present

## 2020-05-02 DIAGNOSIS — E1165 Type 2 diabetes mellitus with hyperglycemia: Secondary | ICD-10-CM | POA: Diagnosis not present

## 2020-05-02 DIAGNOSIS — I10 Essential (primary) hypertension: Secondary | ICD-10-CM | POA: Diagnosis not present

## 2020-05-02 DIAGNOSIS — Z7984 Long term (current) use of oral hypoglycemic drugs: Secondary | ICD-10-CM | POA: Diagnosis not present

## 2020-05-13 DIAGNOSIS — E1140 Type 2 diabetes mellitus with diabetic neuropathy, unspecified: Secondary | ICD-10-CM | POA: Diagnosis not present

## 2020-05-13 DIAGNOSIS — E1151 Type 2 diabetes mellitus with diabetic peripheral angiopathy without gangrene: Secondary | ICD-10-CM | POA: Diagnosis not present

## 2020-06-01 DIAGNOSIS — E1165 Type 2 diabetes mellitus with hyperglycemia: Secondary | ICD-10-CM | POA: Diagnosis not present

## 2020-06-01 DIAGNOSIS — E7849 Other hyperlipidemia: Secondary | ICD-10-CM | POA: Diagnosis not present

## 2020-06-01 DIAGNOSIS — I10 Essential (primary) hypertension: Secondary | ICD-10-CM | POA: Diagnosis not present

## 2020-06-01 DIAGNOSIS — Z7984 Long term (current) use of oral hypoglycemic drugs: Secondary | ICD-10-CM | POA: Diagnosis not present

## 2020-06-26 DIAGNOSIS — K219 Gastro-esophageal reflux disease without esophagitis: Secondary | ICD-10-CM | POA: Diagnosis not present

## 2020-06-26 DIAGNOSIS — I10 Essential (primary) hypertension: Secondary | ICD-10-CM | POA: Diagnosis not present

## 2020-06-26 DIAGNOSIS — E1122 Type 2 diabetes mellitus with diabetic chronic kidney disease: Secondary | ICD-10-CM | POA: Diagnosis not present

## 2020-06-26 DIAGNOSIS — E039 Hypothyroidism, unspecified: Secondary | ICD-10-CM | POA: Diagnosis not present

## 2020-06-26 DIAGNOSIS — E782 Mixed hyperlipidemia: Secondary | ICD-10-CM | POA: Diagnosis not present

## 2020-06-26 DIAGNOSIS — E876 Hypokalemia: Secondary | ICD-10-CM | POA: Diagnosis not present

## 2020-07-01 DIAGNOSIS — Z6841 Body Mass Index (BMI) 40.0 and over, adult: Secondary | ICD-10-CM | POA: Diagnosis not present

## 2020-07-01 DIAGNOSIS — E1142 Type 2 diabetes mellitus with diabetic polyneuropathy: Secondary | ICD-10-CM | POA: Diagnosis not present

## 2020-07-01 DIAGNOSIS — E782 Mixed hyperlipidemia: Secondary | ICD-10-CM | POA: Diagnosis not present

## 2020-07-01 DIAGNOSIS — I480 Paroxysmal atrial fibrillation: Secondary | ICD-10-CM | POA: Diagnosis not present

## 2020-07-01 DIAGNOSIS — I10 Essential (primary) hypertension: Secondary | ICD-10-CM | POA: Diagnosis not present

## 2020-07-01 DIAGNOSIS — Z23 Encounter for immunization: Secondary | ICD-10-CM | POA: Diagnosis not present

## 2020-07-01 DIAGNOSIS — Z0001 Encounter for general adult medical examination with abnormal findings: Secondary | ICD-10-CM | POA: Diagnosis not present

## 2020-07-01 DIAGNOSIS — N200 Calculus of kidney: Secondary | ICD-10-CM | POA: Diagnosis not present

## 2020-07-09 DIAGNOSIS — J209 Acute bronchitis, unspecified: Secondary | ICD-10-CM | POA: Diagnosis not present

## 2020-07-09 DIAGNOSIS — Z0131 Encounter for examination of blood pressure with abnormal findings: Secondary | ICD-10-CM | POA: Diagnosis not present

## 2020-07-09 DIAGNOSIS — Z20822 Contact with and (suspected) exposure to covid-19: Secondary | ICD-10-CM | POA: Diagnosis not present

## 2020-07-09 DIAGNOSIS — U071 COVID-19: Secondary | ICD-10-CM | POA: Diagnosis not present

## 2020-07-09 DIAGNOSIS — J019 Acute sinusitis, unspecified: Secondary | ICD-10-CM | POA: Diagnosis not present

## 2020-07-09 DIAGNOSIS — J029 Acute pharyngitis, unspecified: Secondary | ICD-10-CM | POA: Diagnosis not present

## 2020-08-02 DIAGNOSIS — E1165 Type 2 diabetes mellitus with hyperglycemia: Secondary | ICD-10-CM | POA: Diagnosis not present

## 2020-08-02 DIAGNOSIS — E7849 Other hyperlipidemia: Secondary | ICD-10-CM | POA: Diagnosis not present

## 2020-08-02 DIAGNOSIS — Z7984 Long term (current) use of oral hypoglycemic drugs: Secondary | ICD-10-CM | POA: Diagnosis not present

## 2020-08-02 DIAGNOSIS — I10 Essential (primary) hypertension: Secondary | ICD-10-CM | POA: Diagnosis not present

## 2020-08-12 DIAGNOSIS — E1151 Type 2 diabetes mellitus with diabetic peripheral angiopathy without gangrene: Secondary | ICD-10-CM | POA: Diagnosis not present

## 2020-08-12 DIAGNOSIS — E1140 Type 2 diabetes mellitus with diabetic neuropathy, unspecified: Secondary | ICD-10-CM | POA: Diagnosis not present

## 2020-08-31 DIAGNOSIS — E1165 Type 2 diabetes mellitus with hyperglycemia: Secondary | ICD-10-CM | POA: Diagnosis not present

## 2020-08-31 DIAGNOSIS — I10 Essential (primary) hypertension: Secondary | ICD-10-CM | POA: Diagnosis not present

## 2020-08-31 DIAGNOSIS — Z7984 Long term (current) use of oral hypoglycemic drugs: Secondary | ICD-10-CM | POA: Diagnosis not present

## 2020-08-31 DIAGNOSIS — E7849 Other hyperlipidemia: Secondary | ICD-10-CM | POA: Diagnosis not present

## 2020-09-25 DIAGNOSIS — M170 Bilateral primary osteoarthritis of knee: Secondary | ICD-10-CM | POA: Diagnosis not present

## 2020-09-30 DIAGNOSIS — E7849 Other hyperlipidemia: Secondary | ICD-10-CM | POA: Diagnosis not present

## 2020-09-30 DIAGNOSIS — I10 Essential (primary) hypertension: Secondary | ICD-10-CM | POA: Diagnosis not present

## 2020-09-30 DIAGNOSIS — E1165 Type 2 diabetes mellitus with hyperglycemia: Secondary | ICD-10-CM | POA: Diagnosis not present

## 2020-09-30 DIAGNOSIS — Z7984 Long term (current) use of oral hypoglycemic drugs: Secondary | ICD-10-CM | POA: Diagnosis not present

## 2020-10-02 DIAGNOSIS — M170 Bilateral primary osteoarthritis of knee: Secondary | ICD-10-CM | POA: Diagnosis not present

## 2020-10-04 DIAGNOSIS — E119 Type 2 diabetes mellitus without complications: Secondary | ICD-10-CM | POA: Diagnosis not present

## 2020-10-09 DIAGNOSIS — M170 Bilateral primary osteoarthritis of knee: Secondary | ICD-10-CM | POA: Diagnosis not present

## 2020-10-23 DIAGNOSIS — E1122 Type 2 diabetes mellitus with diabetic chronic kidney disease: Secondary | ICD-10-CM | POA: Diagnosis not present

## 2020-10-23 DIAGNOSIS — E7800 Pure hypercholesterolemia, unspecified: Secondary | ICD-10-CM | POA: Diagnosis not present

## 2020-10-23 DIAGNOSIS — E1121 Type 2 diabetes mellitus with diabetic nephropathy: Secondary | ICD-10-CM | POA: Diagnosis not present

## 2020-10-23 DIAGNOSIS — E7849 Other hyperlipidemia: Secondary | ICD-10-CM | POA: Diagnosis not present

## 2020-10-23 DIAGNOSIS — E1165 Type 2 diabetes mellitus with hyperglycemia: Secondary | ICD-10-CM | POA: Diagnosis not present

## 2020-10-23 DIAGNOSIS — E7801 Familial hypercholesterolemia: Secondary | ICD-10-CM | POA: Diagnosis not present

## 2020-10-23 DIAGNOSIS — E782 Mixed hyperlipidemia: Secondary | ICD-10-CM | POA: Diagnosis not present

## 2020-10-23 DIAGNOSIS — K219 Gastro-esophageal reflux disease without esophagitis: Secondary | ICD-10-CM | POA: Diagnosis not present

## 2020-10-23 DIAGNOSIS — E1129 Type 2 diabetes mellitus with other diabetic kidney complication: Secondary | ICD-10-CM | POA: Diagnosis not present

## 2020-10-23 DIAGNOSIS — I10 Essential (primary) hypertension: Secondary | ICD-10-CM | POA: Diagnosis not present

## 2020-10-28 DIAGNOSIS — E7849 Other hyperlipidemia: Secondary | ICD-10-CM | POA: Diagnosis not present

## 2020-10-28 DIAGNOSIS — I700 Atherosclerosis of aorta: Secondary | ICD-10-CM | POA: Diagnosis not present

## 2020-10-28 DIAGNOSIS — M79671 Pain in right foot: Secondary | ICD-10-CM | POA: Diagnosis not present

## 2020-10-28 DIAGNOSIS — M25579 Pain in unspecified ankle and joints of unspecified foot: Secondary | ICD-10-CM | POA: Diagnosis not present

## 2020-10-28 DIAGNOSIS — I10 Essential (primary) hypertension: Secondary | ICD-10-CM | POA: Diagnosis not present

## 2020-10-28 DIAGNOSIS — E782 Mixed hyperlipidemia: Secondary | ICD-10-CM | POA: Diagnosis not present

## 2020-10-28 DIAGNOSIS — N200 Calculus of kidney: Secondary | ICD-10-CM | POA: Diagnosis not present

## 2020-10-28 DIAGNOSIS — I480 Paroxysmal atrial fibrillation: Secondary | ICD-10-CM | POA: Diagnosis not present

## 2020-10-28 DIAGNOSIS — Z6841 Body Mass Index (BMI) 40.0 and over, adult: Secondary | ICD-10-CM | POA: Diagnosis not present

## 2020-10-28 DIAGNOSIS — E1142 Type 2 diabetes mellitus with diabetic polyneuropathy: Secondary | ICD-10-CM | POA: Diagnosis not present

## 2020-11-04 DIAGNOSIS — E1151 Type 2 diabetes mellitus with diabetic peripheral angiopathy without gangrene: Secondary | ICD-10-CM | POA: Diagnosis not present

## 2020-11-04 DIAGNOSIS — E1140 Type 2 diabetes mellitus with diabetic neuropathy, unspecified: Secondary | ICD-10-CM | POA: Diagnosis not present

## 2020-11-30 DIAGNOSIS — E7849 Other hyperlipidemia: Secondary | ICD-10-CM | POA: Diagnosis not present

## 2020-11-30 DIAGNOSIS — E1165 Type 2 diabetes mellitus with hyperglycemia: Secondary | ICD-10-CM | POA: Diagnosis not present

## 2020-11-30 DIAGNOSIS — I10 Essential (primary) hypertension: Secondary | ICD-10-CM | POA: Diagnosis not present

## 2020-11-30 DIAGNOSIS — Z7984 Long term (current) use of oral hypoglycemic drugs: Secondary | ICD-10-CM | POA: Diagnosis not present

## 2020-12-30 DIAGNOSIS — I10 Essential (primary) hypertension: Secondary | ICD-10-CM | POA: Diagnosis not present

## 2020-12-30 DIAGNOSIS — Z7984 Long term (current) use of oral hypoglycemic drugs: Secondary | ICD-10-CM | POA: Diagnosis not present

## 2020-12-30 DIAGNOSIS — E7849 Other hyperlipidemia: Secondary | ICD-10-CM | POA: Diagnosis not present

## 2020-12-30 DIAGNOSIS — E1165 Type 2 diabetes mellitus with hyperglycemia: Secondary | ICD-10-CM | POA: Diagnosis not present

## 2021-01-16 DIAGNOSIS — E1140 Type 2 diabetes mellitus with diabetic neuropathy, unspecified: Secondary | ICD-10-CM | POA: Diagnosis not present

## 2021-01-16 DIAGNOSIS — E1151 Type 2 diabetes mellitus with diabetic peripheral angiopathy without gangrene: Secondary | ICD-10-CM | POA: Diagnosis not present

## 2021-02-08 DIAGNOSIS — I63439 Cerebral infarction due to embolism of unspecified posterior cerebral artery: Secondary | ICD-10-CM | POA: Diagnosis not present

## 2021-02-08 DIAGNOSIS — I6389 Other cerebral infarction: Secondary | ICD-10-CM | POA: Diagnosis not present

## 2021-02-08 DIAGNOSIS — I429 Cardiomyopathy, unspecified: Secondary | ICD-10-CM | POA: Diagnosis not present

## 2021-02-08 DIAGNOSIS — Z8249 Family history of ischemic heart disease and other diseases of the circulatory system: Secondary | ICD-10-CM | POA: Diagnosis not present

## 2021-02-08 DIAGNOSIS — H5461 Unqualified visual loss, right eye, normal vision left eye: Secondary | ICD-10-CM | POA: Diagnosis not present

## 2021-02-08 DIAGNOSIS — I4891 Unspecified atrial fibrillation: Secondary | ICD-10-CM | POA: Diagnosis not present

## 2021-02-08 DIAGNOSIS — R519 Headache, unspecified: Secondary | ICD-10-CM | POA: Diagnosis not present

## 2021-02-08 DIAGNOSIS — I509 Heart failure, unspecified: Secondary | ICD-10-CM | POA: Diagnosis not present

## 2021-02-08 DIAGNOSIS — Z8709 Personal history of other diseases of the respiratory system: Secondary | ICD-10-CM | POA: Diagnosis not present

## 2021-02-08 DIAGNOSIS — Z87442 Personal history of urinary calculi: Secondary | ICD-10-CM | POA: Diagnosis not present

## 2021-02-08 DIAGNOSIS — Z7984 Long term (current) use of oral hypoglycemic drugs: Secondary | ICD-10-CM | POA: Diagnosis not present

## 2021-02-08 DIAGNOSIS — I4821 Permanent atrial fibrillation: Secondary | ICD-10-CM | POA: Diagnosis not present

## 2021-02-08 DIAGNOSIS — I5020 Unspecified systolic (congestive) heart failure: Secondary | ICD-10-CM | POA: Diagnosis not present

## 2021-02-08 DIAGNOSIS — Z8639 Personal history of other endocrine, nutritional and metabolic disease: Secondary | ICD-10-CM | POA: Diagnosis not present

## 2021-02-08 DIAGNOSIS — R9431 Abnormal electrocardiogram [ECG] [EKG]: Secondary | ICD-10-CM | POA: Diagnosis not present

## 2021-02-08 DIAGNOSIS — I428 Other cardiomyopathies: Secondary | ICD-10-CM | POA: Diagnosis not present

## 2021-02-08 DIAGNOSIS — I700 Atherosclerosis of aorta: Secondary | ICD-10-CM | POA: Diagnosis present

## 2021-02-08 DIAGNOSIS — I639 Cerebral infarction, unspecified: Secondary | ICD-10-CM | POA: Diagnosis not present

## 2021-02-08 DIAGNOSIS — H539 Unspecified visual disturbance: Secondary | ICD-10-CM | POA: Diagnosis not present

## 2021-02-08 DIAGNOSIS — H538 Other visual disturbances: Secondary | ICD-10-CM | POA: Diagnosis not present

## 2021-02-08 DIAGNOSIS — I10 Essential (primary) hypertension: Secondary | ICD-10-CM | POA: Diagnosis not present

## 2021-02-08 DIAGNOSIS — E785 Hyperlipidemia, unspecified: Secondary | ICD-10-CM | POA: Diagnosis present

## 2021-02-08 DIAGNOSIS — Z6839 Body mass index (BMI) 39.0-39.9, adult: Secondary | ICD-10-CM | POA: Diagnosis not present

## 2021-02-08 DIAGNOSIS — R29701 NIHSS score 1: Secondary | ICD-10-CM | POA: Diagnosis present

## 2021-02-08 DIAGNOSIS — Z8679 Personal history of other diseases of the circulatory system: Secondary | ICD-10-CM | POA: Diagnosis not present

## 2021-02-08 DIAGNOSIS — Z8673 Personal history of transient ischemic attack (TIA), and cerebral infarction without residual deficits: Secondary | ICD-10-CM | POA: Diagnosis not present

## 2021-02-08 DIAGNOSIS — I059 Rheumatic mitral valve disease, unspecified: Secondary | ICD-10-CM | POA: Diagnosis not present

## 2021-02-08 DIAGNOSIS — Z7982 Long term (current) use of aspirin: Secondary | ICD-10-CM | POA: Diagnosis not present

## 2021-02-08 DIAGNOSIS — E119 Type 2 diabetes mellitus without complications: Secondary | ICD-10-CM | POA: Diagnosis not present

## 2021-02-08 DIAGNOSIS — I2510 Atherosclerotic heart disease of native coronary artery without angina pectoris: Secondary | ICD-10-CM | POA: Diagnosis present

## 2021-02-08 DIAGNOSIS — I110 Hypertensive heart disease with heart failure: Secondary | ICD-10-CM | POA: Diagnosis present

## 2021-02-08 DIAGNOSIS — I493 Ventricular premature depolarization: Secondary | ICD-10-CM | POA: Diagnosis not present

## 2021-02-08 DIAGNOSIS — H53461 Homonymous bilateral field defects, right side: Secondary | ICD-10-CM | POA: Diagnosis not present

## 2021-02-08 DIAGNOSIS — I482 Chronic atrial fibrillation, unspecified: Secondary | ICD-10-CM | POA: Diagnosis not present

## 2021-02-08 DIAGNOSIS — N400 Enlarged prostate without lower urinary tract symptoms: Secondary | ICD-10-CM | POA: Diagnosis present

## 2021-02-12 DIAGNOSIS — R262 Difficulty in walking, not elsewhere classified: Secondary | ICD-10-CM | POA: Diagnosis not present

## 2021-02-12 DIAGNOSIS — M6281 Muscle weakness (generalized): Secondary | ICD-10-CM | POA: Diagnosis not present

## 2021-02-12 DIAGNOSIS — I69898 Other sequelae of other cerebrovascular disease: Secondary | ICD-10-CM | POA: Diagnosis not present

## 2021-02-13 DIAGNOSIS — M6281 Muscle weakness (generalized): Secondary | ICD-10-CM | POA: Diagnosis not present

## 2021-02-13 DIAGNOSIS — R262 Difficulty in walking, not elsewhere classified: Secondary | ICD-10-CM | POA: Diagnosis not present

## 2021-02-13 DIAGNOSIS — I69898 Other sequelae of other cerebrovascular disease: Secondary | ICD-10-CM | POA: Diagnosis not present

## 2021-02-17 DIAGNOSIS — I69898 Other sequelae of other cerebrovascular disease: Secondary | ICD-10-CM | POA: Diagnosis not present

## 2021-02-17 DIAGNOSIS — M6281 Muscle weakness (generalized): Secondary | ICD-10-CM | POA: Diagnosis not present

## 2021-02-17 DIAGNOSIS — R262 Difficulty in walking, not elsewhere classified: Secondary | ICD-10-CM | POA: Diagnosis not present

## 2021-02-19 DIAGNOSIS — M6281 Muscle weakness (generalized): Secondary | ICD-10-CM | POA: Diagnosis not present

## 2021-02-19 DIAGNOSIS — R262 Difficulty in walking, not elsewhere classified: Secondary | ICD-10-CM | POA: Diagnosis not present

## 2021-02-19 DIAGNOSIS — I69898 Other sequelae of other cerebrovascular disease: Secondary | ICD-10-CM | POA: Diagnosis not present

## 2021-02-20 DIAGNOSIS — M6281 Muscle weakness (generalized): Secondary | ICD-10-CM | POA: Diagnosis not present

## 2021-02-20 DIAGNOSIS — R262 Difficulty in walking, not elsewhere classified: Secondary | ICD-10-CM | POA: Diagnosis not present

## 2021-02-20 DIAGNOSIS — I69898 Other sequelae of other cerebrovascular disease: Secondary | ICD-10-CM | POA: Diagnosis not present

## 2021-02-21 DIAGNOSIS — I69898 Other sequelae of other cerebrovascular disease: Secondary | ICD-10-CM | POA: Diagnosis not present

## 2021-02-21 DIAGNOSIS — R262 Difficulty in walking, not elsewhere classified: Secondary | ICD-10-CM | POA: Diagnosis not present

## 2021-02-21 DIAGNOSIS — M6281 Muscle weakness (generalized): Secondary | ICD-10-CM | POA: Diagnosis not present

## 2021-02-24 DIAGNOSIS — I69898 Other sequelae of other cerebrovascular disease: Secondary | ICD-10-CM | POA: Diagnosis not present

## 2021-02-24 DIAGNOSIS — M6281 Muscle weakness (generalized): Secondary | ICD-10-CM | POA: Diagnosis not present

## 2021-02-24 DIAGNOSIS — R262 Difficulty in walking, not elsewhere classified: Secondary | ICD-10-CM | POA: Diagnosis not present

## 2021-02-26 DIAGNOSIS — M6281 Muscle weakness (generalized): Secondary | ICD-10-CM | POA: Diagnosis not present

## 2021-02-26 DIAGNOSIS — I69898 Other sequelae of other cerebrovascular disease: Secondary | ICD-10-CM | POA: Diagnosis not present

## 2021-02-26 DIAGNOSIS — R262 Difficulty in walking, not elsewhere classified: Secondary | ICD-10-CM | POA: Diagnosis not present

## 2021-02-27 DIAGNOSIS — H5461 Unqualified visual loss, right eye, normal vision left eye: Secondary | ICD-10-CM | POA: Diagnosis not present

## 2021-02-27 DIAGNOSIS — M6281 Muscle weakness (generalized): Secondary | ICD-10-CM | POA: Diagnosis not present

## 2021-02-27 DIAGNOSIS — Z8673 Personal history of transient ischemic attack (TIA), and cerebral infarction without residual deficits: Secondary | ICD-10-CM | POA: Diagnosis not present

## 2021-02-27 DIAGNOSIS — R262 Difficulty in walking, not elsewhere classified: Secondary | ICD-10-CM | POA: Diagnosis not present

## 2021-02-27 DIAGNOSIS — I69898 Other sequelae of other cerebrovascular disease: Secondary | ICD-10-CM | POA: Diagnosis not present

## 2021-02-28 DIAGNOSIS — E782 Mixed hyperlipidemia: Secondary | ICD-10-CM | POA: Diagnosis not present

## 2021-02-28 DIAGNOSIS — E7849 Other hyperlipidemia: Secondary | ICD-10-CM | POA: Diagnosis not present

## 2021-02-28 DIAGNOSIS — E1122 Type 2 diabetes mellitus with diabetic chronic kidney disease: Secondary | ICD-10-CM | POA: Diagnosis not present

## 2021-02-28 DIAGNOSIS — E1142 Type 2 diabetes mellitus with diabetic polyneuropathy: Secondary | ICD-10-CM | POA: Diagnosis not present

## 2021-02-28 DIAGNOSIS — I259 Chronic ischemic heart disease, unspecified: Secondary | ICD-10-CM | POA: Diagnosis not present

## 2021-02-28 DIAGNOSIS — E1129 Type 2 diabetes mellitus with other diabetic kidney complication: Secondary | ICD-10-CM | POA: Diagnosis not present

## 2021-02-28 DIAGNOSIS — I10 Essential (primary) hypertension: Secondary | ICD-10-CM | POA: Diagnosis not present

## 2021-03-02 DIAGNOSIS — Z7984 Long term (current) use of oral hypoglycemic drugs: Secondary | ICD-10-CM | POA: Diagnosis not present

## 2021-03-02 DIAGNOSIS — E7849 Other hyperlipidemia: Secondary | ICD-10-CM | POA: Diagnosis not present

## 2021-03-02 DIAGNOSIS — E1165 Type 2 diabetes mellitus with hyperglycemia: Secondary | ICD-10-CM | POA: Diagnosis not present

## 2021-03-02 DIAGNOSIS — I10 Essential (primary) hypertension: Secondary | ICD-10-CM | POA: Diagnosis not present

## 2021-03-03 DIAGNOSIS — I69898 Other sequelae of other cerebrovascular disease: Secondary | ICD-10-CM | POA: Diagnosis not present

## 2021-03-03 DIAGNOSIS — R262 Difficulty in walking, not elsewhere classified: Secondary | ICD-10-CM | POA: Diagnosis not present

## 2021-03-03 DIAGNOSIS — M6281 Muscle weakness (generalized): Secondary | ICD-10-CM | POA: Diagnosis not present

## 2021-03-05 DIAGNOSIS — I700 Atherosclerosis of aorta: Secondary | ICD-10-CM | POA: Diagnosis not present

## 2021-03-05 DIAGNOSIS — Z6841 Body Mass Index (BMI) 40.0 and over, adult: Secondary | ICD-10-CM | POA: Diagnosis not present

## 2021-03-05 DIAGNOSIS — M6281 Muscle weakness (generalized): Secondary | ICD-10-CM | POA: Diagnosis not present

## 2021-03-05 DIAGNOSIS — I480 Paroxysmal atrial fibrillation: Secondary | ICD-10-CM | POA: Diagnosis not present

## 2021-03-05 DIAGNOSIS — E1142 Type 2 diabetes mellitus with diabetic polyneuropathy: Secondary | ICD-10-CM | POA: Diagnosis not present

## 2021-03-05 DIAGNOSIS — I69898 Other sequelae of other cerebrovascular disease: Secondary | ICD-10-CM | POA: Diagnosis not present

## 2021-03-05 DIAGNOSIS — N200 Calculus of kidney: Secondary | ICD-10-CM | POA: Diagnosis not present

## 2021-03-05 DIAGNOSIS — E7849 Other hyperlipidemia: Secondary | ICD-10-CM | POA: Diagnosis not present

## 2021-03-05 DIAGNOSIS — I10 Essential (primary) hypertension: Secondary | ICD-10-CM | POA: Diagnosis not present

## 2021-03-05 DIAGNOSIS — R262 Difficulty in walking, not elsewhere classified: Secondary | ICD-10-CM | POA: Diagnosis not present

## 2021-03-05 DIAGNOSIS — Z8673 Personal history of transient ischemic attack (TIA), and cerebral infarction without residual deficits: Secondary | ICD-10-CM | POA: Diagnosis not present

## 2021-03-06 DIAGNOSIS — R262 Difficulty in walking, not elsewhere classified: Secondary | ICD-10-CM | POA: Diagnosis not present

## 2021-03-06 DIAGNOSIS — M6281 Muscle weakness (generalized): Secondary | ICD-10-CM | POA: Diagnosis not present

## 2021-03-06 DIAGNOSIS — I69898 Other sequelae of other cerebrovascular disease: Secondary | ICD-10-CM | POA: Diagnosis not present

## 2021-03-07 DIAGNOSIS — I69898 Other sequelae of other cerebrovascular disease: Secondary | ICD-10-CM | POA: Diagnosis not present

## 2021-03-07 DIAGNOSIS — R262 Difficulty in walking, not elsewhere classified: Secondary | ICD-10-CM | POA: Diagnosis not present

## 2021-03-07 DIAGNOSIS — M6281 Muscle weakness (generalized): Secondary | ICD-10-CM | POA: Diagnosis not present

## 2021-03-10 DIAGNOSIS — M6281 Muscle weakness (generalized): Secondary | ICD-10-CM | POA: Diagnosis not present

## 2021-03-10 DIAGNOSIS — R262 Difficulty in walking, not elsewhere classified: Secondary | ICD-10-CM | POA: Diagnosis not present

## 2021-03-10 DIAGNOSIS — I69898 Other sequelae of other cerebrovascular disease: Secondary | ICD-10-CM | POA: Diagnosis not present

## 2021-03-11 DIAGNOSIS — I69898 Other sequelae of other cerebrovascular disease: Secondary | ICD-10-CM | POA: Diagnosis not present

## 2021-03-11 DIAGNOSIS — R262 Difficulty in walking, not elsewhere classified: Secondary | ICD-10-CM | POA: Diagnosis not present

## 2021-03-11 DIAGNOSIS — M6281 Muscle weakness (generalized): Secondary | ICD-10-CM | POA: Diagnosis not present

## 2021-03-12 DIAGNOSIS — I69898 Other sequelae of other cerebrovascular disease: Secondary | ICD-10-CM | POA: Diagnosis not present

## 2021-03-12 DIAGNOSIS — M6281 Muscle weakness (generalized): Secondary | ICD-10-CM | POA: Diagnosis not present

## 2021-03-12 DIAGNOSIS — R262 Difficulty in walking, not elsewhere classified: Secondary | ICD-10-CM | POA: Diagnosis not present

## 2021-03-13 DIAGNOSIS — I69898 Other sequelae of other cerebrovascular disease: Secondary | ICD-10-CM | POA: Diagnosis not present

## 2021-03-13 DIAGNOSIS — R262 Difficulty in walking, not elsewhere classified: Secondary | ICD-10-CM | POA: Diagnosis not present

## 2021-03-13 DIAGNOSIS — I10 Essential (primary) hypertension: Secondary | ICD-10-CM | POA: Diagnosis not present

## 2021-03-13 DIAGNOSIS — M6281 Muscle weakness (generalized): Secondary | ICD-10-CM | POA: Diagnosis not present

## 2021-03-13 DIAGNOSIS — E1142 Type 2 diabetes mellitus with diabetic polyneuropathy: Secondary | ICD-10-CM | POA: Diagnosis not present

## 2021-03-13 DIAGNOSIS — I639 Cerebral infarction, unspecified: Secondary | ICD-10-CM | POA: Diagnosis not present

## 2021-03-13 DIAGNOSIS — E7849 Other hyperlipidemia: Secondary | ICD-10-CM | POA: Diagnosis not present

## 2021-03-14 DIAGNOSIS — I10 Essential (primary) hypertension: Secondary | ICD-10-CM | POA: Diagnosis not present

## 2021-03-14 DIAGNOSIS — I4821 Permanent atrial fibrillation: Secondary | ICD-10-CM | POA: Diagnosis not present

## 2021-03-14 DIAGNOSIS — I420 Dilated cardiomyopathy: Secondary | ICD-10-CM | POA: Diagnosis not present

## 2021-03-14 DIAGNOSIS — I2510 Atherosclerotic heart disease of native coronary artery without angina pectoris: Secondary | ICD-10-CM | POA: Diagnosis not present

## 2021-03-17 DIAGNOSIS — I69898 Other sequelae of other cerebrovascular disease: Secondary | ICD-10-CM | POA: Diagnosis not present

## 2021-03-17 DIAGNOSIS — M6281 Muscle weakness (generalized): Secondary | ICD-10-CM | POA: Diagnosis not present

## 2021-03-17 DIAGNOSIS — R262 Difficulty in walking, not elsewhere classified: Secondary | ICD-10-CM | POA: Diagnosis not present

## 2021-03-19 DIAGNOSIS — R262 Difficulty in walking, not elsewhere classified: Secondary | ICD-10-CM | POA: Diagnosis not present

## 2021-03-19 DIAGNOSIS — I69898 Other sequelae of other cerebrovascular disease: Secondary | ICD-10-CM | POA: Diagnosis not present

## 2021-03-19 DIAGNOSIS — M6281 Muscle weakness (generalized): Secondary | ICD-10-CM | POA: Diagnosis not present

## 2021-03-21 DIAGNOSIS — R262 Difficulty in walking, not elsewhere classified: Secondary | ICD-10-CM | POA: Diagnosis not present

## 2021-03-21 DIAGNOSIS — I69898 Other sequelae of other cerebrovascular disease: Secondary | ICD-10-CM | POA: Diagnosis not present

## 2021-03-21 DIAGNOSIS — M6281 Muscle weakness (generalized): Secondary | ICD-10-CM | POA: Diagnosis not present

## 2021-03-24 DIAGNOSIS — I69898 Other sequelae of other cerebrovascular disease: Secondary | ICD-10-CM | POA: Diagnosis not present

## 2021-03-24 DIAGNOSIS — R262 Difficulty in walking, not elsewhere classified: Secondary | ICD-10-CM | POA: Diagnosis not present

## 2021-03-24 DIAGNOSIS — M6281 Muscle weakness (generalized): Secondary | ICD-10-CM | POA: Diagnosis not present

## 2021-03-25 DIAGNOSIS — I69898 Other sequelae of other cerebrovascular disease: Secondary | ICD-10-CM | POA: Diagnosis not present

## 2021-03-25 DIAGNOSIS — R262 Difficulty in walking, not elsewhere classified: Secondary | ICD-10-CM | POA: Diagnosis not present

## 2021-03-25 DIAGNOSIS — M6281 Muscle weakness (generalized): Secondary | ICD-10-CM | POA: Diagnosis not present

## 2021-03-28 DIAGNOSIS — I69898 Other sequelae of other cerebrovascular disease: Secondary | ICD-10-CM | POA: Diagnosis not present

## 2021-03-28 DIAGNOSIS — M6281 Muscle weakness (generalized): Secondary | ICD-10-CM | POA: Diagnosis not present

## 2021-03-28 DIAGNOSIS — R262 Difficulty in walking, not elsewhere classified: Secondary | ICD-10-CM | POA: Diagnosis not present

## 2021-03-31 DIAGNOSIS — R262 Difficulty in walking, not elsewhere classified: Secondary | ICD-10-CM | POA: Diagnosis not present

## 2021-03-31 DIAGNOSIS — M6281 Muscle weakness (generalized): Secondary | ICD-10-CM | POA: Diagnosis not present

## 2021-03-31 DIAGNOSIS — I69898 Other sequelae of other cerebrovascular disease: Secondary | ICD-10-CM | POA: Diagnosis not present

## 2021-04-02 DIAGNOSIS — R262 Difficulty in walking, not elsewhere classified: Secondary | ICD-10-CM | POA: Diagnosis not present

## 2021-04-02 DIAGNOSIS — I69898 Other sequelae of other cerebrovascular disease: Secondary | ICD-10-CM | POA: Diagnosis not present

## 2021-04-02 DIAGNOSIS — M6281 Muscle weakness (generalized): Secondary | ICD-10-CM | POA: Diagnosis not present

## 2021-04-04 DIAGNOSIS — I69898 Other sequelae of other cerebrovascular disease: Secondary | ICD-10-CM | POA: Diagnosis not present

## 2021-04-04 DIAGNOSIS — R262 Difficulty in walking, not elsewhere classified: Secondary | ICD-10-CM | POA: Diagnosis not present

## 2021-04-04 DIAGNOSIS — M6281 Muscle weakness (generalized): Secondary | ICD-10-CM | POA: Diagnosis not present

## 2021-04-08 DIAGNOSIS — I4821 Permanent atrial fibrillation: Secondary | ICD-10-CM | POA: Diagnosis not present

## 2021-04-08 DIAGNOSIS — I2510 Atherosclerotic heart disease of native coronary artery without angina pectoris: Secondary | ICD-10-CM | POA: Diagnosis not present

## 2021-04-09 DIAGNOSIS — R262 Difficulty in walking, not elsewhere classified: Secondary | ICD-10-CM | POA: Diagnosis not present

## 2021-04-09 DIAGNOSIS — I69898 Other sequelae of other cerebrovascular disease: Secondary | ICD-10-CM | POA: Diagnosis not present

## 2021-04-09 DIAGNOSIS — M6281 Muscle weakness (generalized): Secondary | ICD-10-CM | POA: Diagnosis not present

## 2021-04-10 DIAGNOSIS — E1151 Type 2 diabetes mellitus with diabetic peripheral angiopathy without gangrene: Secondary | ICD-10-CM | POA: Diagnosis not present

## 2021-04-10 DIAGNOSIS — E1140 Type 2 diabetes mellitus with diabetic neuropathy, unspecified: Secondary | ICD-10-CM | POA: Diagnosis not present

## 2021-04-11 DIAGNOSIS — R262 Difficulty in walking, not elsewhere classified: Secondary | ICD-10-CM | POA: Diagnosis not present

## 2021-04-11 DIAGNOSIS — M6281 Muscle weakness (generalized): Secondary | ICD-10-CM | POA: Diagnosis not present

## 2021-04-11 DIAGNOSIS — I69898 Other sequelae of other cerebrovascular disease: Secondary | ICD-10-CM | POA: Diagnosis not present

## 2021-04-14 DIAGNOSIS — R262 Difficulty in walking, not elsewhere classified: Secondary | ICD-10-CM | POA: Diagnosis not present

## 2021-04-14 DIAGNOSIS — M6281 Muscle weakness (generalized): Secondary | ICD-10-CM | POA: Diagnosis not present

## 2021-04-14 DIAGNOSIS — I69898 Other sequelae of other cerebrovascular disease: Secondary | ICD-10-CM | POA: Diagnosis not present

## 2021-04-16 DIAGNOSIS — M6281 Muscle weakness (generalized): Secondary | ICD-10-CM | POA: Diagnosis not present

## 2021-04-16 DIAGNOSIS — R262 Difficulty in walking, not elsewhere classified: Secondary | ICD-10-CM | POA: Diagnosis not present

## 2021-04-16 DIAGNOSIS — I69898 Other sequelae of other cerebrovascular disease: Secondary | ICD-10-CM | POA: Diagnosis not present

## 2021-04-17 DIAGNOSIS — R262 Difficulty in walking, not elsewhere classified: Secondary | ICD-10-CM | POA: Diagnosis not present

## 2021-04-17 DIAGNOSIS — M6281 Muscle weakness (generalized): Secondary | ICD-10-CM | POA: Diagnosis not present

## 2021-04-17 DIAGNOSIS — I69898 Other sequelae of other cerebrovascular disease: Secondary | ICD-10-CM | POA: Diagnosis not present

## 2021-04-21 DIAGNOSIS — I69898 Other sequelae of other cerebrovascular disease: Secondary | ICD-10-CM | POA: Diagnosis not present

## 2021-04-21 DIAGNOSIS — R262 Difficulty in walking, not elsewhere classified: Secondary | ICD-10-CM | POA: Diagnosis not present

## 2021-04-21 DIAGNOSIS — M6281 Muscle weakness (generalized): Secondary | ICD-10-CM | POA: Diagnosis not present

## 2021-04-23 DIAGNOSIS — M6281 Muscle weakness (generalized): Secondary | ICD-10-CM | POA: Diagnosis not present

## 2021-04-23 DIAGNOSIS — I69898 Other sequelae of other cerebrovascular disease: Secondary | ICD-10-CM | POA: Diagnosis not present

## 2021-04-23 DIAGNOSIS — R262 Difficulty in walking, not elsewhere classified: Secondary | ICD-10-CM | POA: Diagnosis not present

## 2021-04-25 DIAGNOSIS — M6281 Muscle weakness (generalized): Secondary | ICD-10-CM | POA: Diagnosis not present

## 2021-04-25 DIAGNOSIS — R262 Difficulty in walking, not elsewhere classified: Secondary | ICD-10-CM | POA: Diagnosis not present

## 2021-04-25 DIAGNOSIS — I69898 Other sequelae of other cerebrovascular disease: Secondary | ICD-10-CM | POA: Diagnosis not present

## 2021-04-28 DIAGNOSIS — R262 Difficulty in walking, not elsewhere classified: Secondary | ICD-10-CM | POA: Diagnosis not present

## 2021-04-28 DIAGNOSIS — M6281 Muscle weakness (generalized): Secondary | ICD-10-CM | POA: Diagnosis not present

## 2021-04-28 DIAGNOSIS — I69898 Other sequelae of other cerebrovascular disease: Secondary | ICD-10-CM | POA: Diagnosis not present

## 2021-04-30 DIAGNOSIS — I4821 Permanent atrial fibrillation: Secondary | ICD-10-CM | POA: Diagnosis not present

## 2021-05-01 DIAGNOSIS — I69898 Other sequelae of other cerebrovascular disease: Secondary | ICD-10-CM | POA: Diagnosis not present

## 2021-05-01 DIAGNOSIS — M6281 Muscle weakness (generalized): Secondary | ICD-10-CM | POA: Diagnosis not present

## 2021-05-01 DIAGNOSIS — R262 Difficulty in walking, not elsewhere classified: Secondary | ICD-10-CM | POA: Diagnosis not present

## 2021-05-02 DIAGNOSIS — I69898 Other sequelae of other cerebrovascular disease: Secondary | ICD-10-CM | POA: Diagnosis not present

## 2021-05-02 DIAGNOSIS — M6281 Muscle weakness (generalized): Secondary | ICD-10-CM | POA: Diagnosis not present

## 2021-05-02 DIAGNOSIS — R262 Difficulty in walking, not elsewhere classified: Secondary | ICD-10-CM | POA: Diagnosis not present

## 2021-05-06 DIAGNOSIS — R262 Difficulty in walking, not elsewhere classified: Secondary | ICD-10-CM | POA: Diagnosis not present

## 2021-05-06 DIAGNOSIS — M6281 Muscle weakness (generalized): Secondary | ICD-10-CM | POA: Diagnosis not present

## 2021-05-06 DIAGNOSIS — I69898 Other sequelae of other cerebrovascular disease: Secondary | ICD-10-CM | POA: Diagnosis not present

## 2021-05-08 DIAGNOSIS — I69898 Other sequelae of other cerebrovascular disease: Secondary | ICD-10-CM | POA: Diagnosis not present

## 2021-05-08 DIAGNOSIS — M6281 Muscle weakness (generalized): Secondary | ICD-10-CM | POA: Diagnosis not present

## 2021-05-08 DIAGNOSIS — R262 Difficulty in walking, not elsewhere classified: Secondary | ICD-10-CM | POA: Diagnosis not present

## 2021-05-09 DIAGNOSIS — I69898 Other sequelae of other cerebrovascular disease: Secondary | ICD-10-CM | POA: Diagnosis not present

## 2021-05-09 DIAGNOSIS — R262 Difficulty in walking, not elsewhere classified: Secondary | ICD-10-CM | POA: Diagnosis not present

## 2021-05-09 DIAGNOSIS — M6281 Muscle weakness (generalized): Secondary | ICD-10-CM | POA: Diagnosis not present

## 2021-05-12 DIAGNOSIS — I4821 Permanent atrial fibrillation: Secondary | ICD-10-CM | POA: Diagnosis not present

## 2021-05-12 DIAGNOSIS — Z7984 Long term (current) use of oral hypoglycemic drugs: Secondary | ICD-10-CM | POA: Diagnosis not present

## 2021-05-12 DIAGNOSIS — N200 Calculus of kidney: Secondary | ICD-10-CM | POA: Diagnosis not present

## 2021-05-12 DIAGNOSIS — Z7982 Long term (current) use of aspirin: Secondary | ICD-10-CM | POA: Diagnosis not present

## 2021-05-12 DIAGNOSIS — E785 Hyperlipidemia, unspecified: Secondary | ICD-10-CM | POA: Diagnosis not present

## 2021-05-12 DIAGNOSIS — E119 Type 2 diabetes mellitus without complications: Secondary | ICD-10-CM | POA: Diagnosis not present

## 2021-05-12 DIAGNOSIS — Z8673 Personal history of transient ischemic attack (TIA), and cerebral infarction without residual deficits: Secondary | ICD-10-CM | POA: Diagnosis not present

## 2021-05-12 DIAGNOSIS — Z79899 Other long term (current) drug therapy: Secondary | ICD-10-CM | POA: Diagnosis not present

## 2021-05-12 DIAGNOSIS — Z91048 Other nonmedicinal substance allergy status: Secondary | ICD-10-CM | POA: Diagnosis not present

## 2021-05-12 DIAGNOSIS — Z91013 Allergy to seafood: Secondary | ICD-10-CM | POA: Diagnosis not present

## 2021-05-12 DIAGNOSIS — Z85828 Personal history of other malignant neoplasm of skin: Secondary | ICD-10-CM | POA: Diagnosis not present

## 2021-05-12 DIAGNOSIS — I10 Essential (primary) hypertension: Secondary | ICD-10-CM | POA: Diagnosis not present

## 2021-05-12 DIAGNOSIS — I2510 Atherosclerotic heart disease of native coronary artery without angina pectoris: Secondary | ICD-10-CM | POA: Diagnosis not present

## 2021-05-14 DIAGNOSIS — I69898 Other sequelae of other cerebrovascular disease: Secondary | ICD-10-CM | POA: Diagnosis not present

## 2021-05-14 DIAGNOSIS — M6281 Muscle weakness (generalized): Secondary | ICD-10-CM | POA: Diagnosis not present

## 2021-05-14 DIAGNOSIS — R262 Difficulty in walking, not elsewhere classified: Secondary | ICD-10-CM | POA: Diagnosis not present

## 2021-05-16 DIAGNOSIS — R262 Difficulty in walking, not elsewhere classified: Secondary | ICD-10-CM | POA: Diagnosis not present

## 2021-05-16 DIAGNOSIS — I69898 Other sequelae of other cerebrovascular disease: Secondary | ICD-10-CM | POA: Diagnosis not present

## 2021-05-16 DIAGNOSIS — M6281 Muscle weakness (generalized): Secondary | ICD-10-CM | POA: Diagnosis not present

## 2021-05-19 DIAGNOSIS — I69898 Other sequelae of other cerebrovascular disease: Secondary | ICD-10-CM | POA: Diagnosis not present

## 2021-05-19 DIAGNOSIS — M6281 Muscle weakness (generalized): Secondary | ICD-10-CM | POA: Diagnosis not present

## 2021-05-19 DIAGNOSIS — R262 Difficulty in walking, not elsewhere classified: Secondary | ICD-10-CM | POA: Diagnosis not present

## 2021-05-23 DIAGNOSIS — I69898 Other sequelae of other cerebrovascular disease: Secondary | ICD-10-CM | POA: Diagnosis not present

## 2021-05-23 DIAGNOSIS — R262 Difficulty in walking, not elsewhere classified: Secondary | ICD-10-CM | POA: Diagnosis not present

## 2021-05-23 DIAGNOSIS — M6281 Muscle weakness (generalized): Secondary | ICD-10-CM | POA: Diagnosis not present

## 2021-06-02 DIAGNOSIS — E1165 Type 2 diabetes mellitus with hyperglycemia: Secondary | ICD-10-CM | POA: Diagnosis not present

## 2021-06-02 DIAGNOSIS — E7849 Other hyperlipidemia: Secondary | ICD-10-CM | POA: Diagnosis not present

## 2021-06-02 DIAGNOSIS — I10 Essential (primary) hypertension: Secondary | ICD-10-CM | POA: Diagnosis not present

## 2021-06-02 DIAGNOSIS — H53461 Homonymous bilateral field defects, right side: Secondary | ICD-10-CM | POA: Diagnosis not present

## 2021-06-02 DIAGNOSIS — Z7984 Long term (current) use of oral hypoglycemic drugs: Secondary | ICD-10-CM | POA: Diagnosis not present

## 2021-06-09 DIAGNOSIS — M170 Bilateral primary osteoarthritis of knee: Secondary | ICD-10-CM | POA: Diagnosis not present

## 2021-06-10 DIAGNOSIS — E1140 Type 2 diabetes mellitus with diabetic neuropathy, unspecified: Secondary | ICD-10-CM | POA: Diagnosis not present

## 2021-06-10 DIAGNOSIS — E1151 Type 2 diabetes mellitus with diabetic peripheral angiopathy without gangrene: Secondary | ICD-10-CM | POA: Diagnosis not present

## 2021-06-16 DIAGNOSIS — M170 Bilateral primary osteoarthritis of knee: Secondary | ICD-10-CM | POA: Diagnosis not present

## 2021-06-23 DIAGNOSIS — M170 Bilateral primary osteoarthritis of knee: Secondary | ICD-10-CM | POA: Diagnosis not present

## 2021-06-30 DIAGNOSIS — E7849 Other hyperlipidemia: Secondary | ICD-10-CM | POA: Diagnosis not present

## 2021-06-30 DIAGNOSIS — E876 Hypokalemia: Secondary | ICD-10-CM | POA: Diagnosis not present

## 2021-06-30 DIAGNOSIS — K219 Gastro-esophageal reflux disease without esophagitis: Secondary | ICD-10-CM | POA: Diagnosis not present

## 2021-06-30 DIAGNOSIS — I10 Essential (primary) hypertension: Secondary | ICD-10-CM | POA: Diagnosis not present

## 2021-06-30 DIAGNOSIS — E1122 Type 2 diabetes mellitus with diabetic chronic kidney disease: Secondary | ICD-10-CM | POA: Diagnosis not present

## 2021-06-30 DIAGNOSIS — E782 Mixed hyperlipidemia: Secondary | ICD-10-CM | POA: Diagnosis not present

## 2021-06-30 DIAGNOSIS — I259 Chronic ischemic heart disease, unspecified: Secondary | ICD-10-CM | POA: Diagnosis not present

## 2021-07-03 DIAGNOSIS — Z8673 Personal history of transient ischemic attack (TIA), and cerebral infarction without residual deficits: Secondary | ICD-10-CM | POA: Diagnosis not present

## 2021-07-03 DIAGNOSIS — E1142 Type 2 diabetes mellitus with diabetic polyneuropathy: Secondary | ICD-10-CM | POA: Diagnosis not present

## 2021-07-03 DIAGNOSIS — E7849 Other hyperlipidemia: Secondary | ICD-10-CM | POA: Diagnosis not present

## 2021-07-03 DIAGNOSIS — Z23 Encounter for immunization: Secondary | ICD-10-CM | POA: Diagnosis not present

## 2021-07-03 DIAGNOSIS — I480 Paroxysmal atrial fibrillation: Secondary | ICD-10-CM | POA: Diagnosis not present

## 2021-07-03 DIAGNOSIS — Z7189 Other specified counseling: Secondary | ICD-10-CM | POA: Diagnosis not present

## 2021-07-03 DIAGNOSIS — N200 Calculus of kidney: Secondary | ICD-10-CM | POA: Diagnosis not present

## 2021-07-03 DIAGNOSIS — I10 Essential (primary) hypertension: Secondary | ICD-10-CM | POA: Diagnosis not present

## 2021-09-02 DIAGNOSIS — E1140 Type 2 diabetes mellitus with diabetic neuropathy, unspecified: Secondary | ICD-10-CM | POA: Diagnosis not present

## 2021-09-02 DIAGNOSIS — E1151 Type 2 diabetes mellitus with diabetic peripheral angiopathy without gangrene: Secondary | ICD-10-CM | POA: Diagnosis not present

## 2021-09-05 DIAGNOSIS — M1711 Unilateral primary osteoarthritis, right knee: Secondary | ICD-10-CM | POA: Diagnosis not present

## 2021-09-05 DIAGNOSIS — M170 Bilateral primary osteoarthritis of knee: Secondary | ICD-10-CM | POA: Diagnosis not present

## 2021-09-05 DIAGNOSIS — M2170 Unequal limb length (acquired), unspecified site: Secondary | ICD-10-CM | POA: Diagnosis not present

## 2021-09-05 DIAGNOSIS — M1712 Unilateral primary osteoarthritis, left knee: Secondary | ICD-10-CM | POA: Diagnosis not present

## 2021-10-29 DIAGNOSIS — E7801 Familial hypercholesterolemia: Secondary | ICD-10-CM | POA: Diagnosis not present

## 2021-10-29 DIAGNOSIS — E1165 Type 2 diabetes mellitus with hyperglycemia: Secondary | ICD-10-CM | POA: Diagnosis not present

## 2021-10-29 DIAGNOSIS — E7849 Other hyperlipidemia: Secondary | ICD-10-CM | POA: Diagnosis not present

## 2021-10-29 DIAGNOSIS — E1122 Type 2 diabetes mellitus with diabetic chronic kidney disease: Secondary | ICD-10-CM | POA: Diagnosis not present

## 2021-10-29 DIAGNOSIS — E1142 Type 2 diabetes mellitus with diabetic polyneuropathy: Secondary | ICD-10-CM | POA: Diagnosis not present

## 2021-10-29 DIAGNOSIS — E7800 Pure hypercholesterolemia, unspecified: Secondary | ICD-10-CM | POA: Diagnosis not present

## 2021-10-29 DIAGNOSIS — I10 Essential (primary) hypertension: Secondary | ICD-10-CM | POA: Diagnosis not present

## 2021-10-29 DIAGNOSIS — Z8673 Personal history of transient ischemic attack (TIA), and cerebral infarction without residual deficits: Secondary | ICD-10-CM | POA: Diagnosis not present

## 2021-10-29 DIAGNOSIS — E782 Mixed hyperlipidemia: Secondary | ICD-10-CM | POA: Diagnosis not present

## 2021-11-03 DIAGNOSIS — Z6833 Body mass index (BMI) 33.0-33.9, adult: Secondary | ICD-10-CM | POA: Diagnosis not present

## 2021-11-03 DIAGNOSIS — I700 Atherosclerosis of aorta: Secondary | ICD-10-CM | POA: Diagnosis not present

## 2021-11-03 DIAGNOSIS — E1142 Type 2 diabetes mellitus with diabetic polyneuropathy: Secondary | ICD-10-CM | POA: Diagnosis not present

## 2021-11-03 DIAGNOSIS — N200 Calculus of kidney: Secondary | ICD-10-CM | POA: Diagnosis not present

## 2021-11-03 DIAGNOSIS — E782 Mixed hyperlipidemia: Secondary | ICD-10-CM | POA: Diagnosis not present

## 2021-11-03 DIAGNOSIS — D485 Neoplasm of uncertain behavior of skin: Secondary | ICD-10-CM | POA: Diagnosis not present

## 2021-11-03 DIAGNOSIS — E7849 Other hyperlipidemia: Secondary | ICD-10-CM | POA: Diagnosis not present

## 2021-11-03 DIAGNOSIS — Z8673 Personal history of transient ischemic attack (TIA), and cerebral infarction without residual deficits: Secondary | ICD-10-CM | POA: Diagnosis not present

## 2021-11-03 DIAGNOSIS — E6609 Other obesity due to excess calories: Secondary | ICD-10-CM | POA: Diagnosis not present

## 2021-11-03 DIAGNOSIS — I480 Paroxysmal atrial fibrillation: Secondary | ICD-10-CM | POA: Diagnosis not present

## 2021-11-03 DIAGNOSIS — I10 Essential (primary) hypertension: Secondary | ICD-10-CM | POA: Diagnosis not present

## 2021-11-05 DIAGNOSIS — D485 Neoplasm of uncertain behavior of skin: Secondary | ICD-10-CM | POA: Diagnosis not present

## 2021-11-05 DIAGNOSIS — C44319 Basal cell carcinoma of skin of other parts of face: Secondary | ICD-10-CM | POA: Diagnosis not present

## 2021-11-10 DIAGNOSIS — I4891 Unspecified atrial fibrillation: Secondary | ICD-10-CM | POA: Diagnosis not present

## 2021-11-10 DIAGNOSIS — M170 Bilateral primary osteoarthritis of knee: Secondary | ICD-10-CM | POA: Diagnosis not present

## 2021-11-10 DIAGNOSIS — R9431 Abnormal electrocardiogram [ECG] [EKG]: Secondary | ICD-10-CM | POA: Diagnosis not present

## 2021-11-10 DIAGNOSIS — Z01810 Encounter for preprocedural cardiovascular examination: Secondary | ICD-10-CM | POA: Diagnosis not present

## 2021-11-12 DIAGNOSIS — I4891 Unspecified atrial fibrillation: Secondary | ICD-10-CM | POA: Diagnosis not present

## 2021-11-12 DIAGNOSIS — E1121 Type 2 diabetes mellitus with diabetic nephropathy: Secondary | ICD-10-CM | POA: Diagnosis not present

## 2021-11-12 DIAGNOSIS — I700 Atherosclerosis of aorta: Secondary | ICD-10-CM | POA: Diagnosis not present

## 2021-11-12 DIAGNOSIS — I259 Chronic ischemic heart disease, unspecified: Secondary | ICD-10-CM | POA: Diagnosis not present

## 2021-11-12 DIAGNOSIS — E1142 Type 2 diabetes mellitus with diabetic polyneuropathy: Secondary | ICD-10-CM | POA: Diagnosis not present

## 2021-11-12 DIAGNOSIS — I482 Chronic atrial fibrillation, unspecified: Secondary | ICD-10-CM | POA: Diagnosis not present

## 2021-11-12 DIAGNOSIS — I10 Essential (primary) hypertension: Secondary | ICD-10-CM | POA: Diagnosis not present

## 2021-11-12 DIAGNOSIS — E1122 Type 2 diabetes mellitus with diabetic chronic kidney disease: Secondary | ICD-10-CM | POA: Diagnosis not present

## 2021-11-18 DIAGNOSIS — E1140 Type 2 diabetes mellitus with diabetic neuropathy, unspecified: Secondary | ICD-10-CM | POA: Diagnosis not present

## 2021-11-18 DIAGNOSIS — E1151 Type 2 diabetes mellitus with diabetic peripheral angiopathy without gangrene: Secondary | ICD-10-CM | POA: Diagnosis not present

## 2021-11-21 DIAGNOSIS — I2510 Atherosclerotic heart disease of native coronary artery without angina pectoris: Secondary | ICD-10-CM | POA: Diagnosis not present

## 2021-11-21 DIAGNOSIS — I4821 Permanent atrial fibrillation: Secondary | ICD-10-CM | POA: Diagnosis not present

## 2021-11-21 DIAGNOSIS — I10 Essential (primary) hypertension: Secondary | ICD-10-CM | POA: Diagnosis not present

## 2021-11-21 DIAGNOSIS — I420 Dilated cardiomyopathy: Secondary | ICD-10-CM | POA: Diagnosis not present

## 2021-12-01 DIAGNOSIS — M1711 Unilateral primary osteoarthritis, right knee: Secondary | ICD-10-CM | POA: Diagnosis not present

## 2021-12-01 DIAGNOSIS — E7800 Pure hypercholesterolemia, unspecified: Secondary | ICD-10-CM | POA: Diagnosis not present

## 2021-12-01 DIAGNOSIS — I10 Essential (primary) hypertension: Secondary | ICD-10-CM | POA: Diagnosis not present

## 2021-12-01 DIAGNOSIS — E119 Type 2 diabetes mellitus without complications: Secondary | ICD-10-CM | POA: Diagnosis not present

## 2021-12-01 DIAGNOSIS — Z85828 Personal history of other malignant neoplasm of skin: Secondary | ICD-10-CM | POA: Diagnosis not present

## 2021-12-01 DIAGNOSIS — M24561 Contracture, right knee: Secondary | ICD-10-CM | POA: Diagnosis not present

## 2021-12-01 DIAGNOSIS — Z8673 Personal history of transient ischemic attack (TIA), and cerebral infarction without residual deficits: Secondary | ICD-10-CM | POA: Diagnosis not present

## 2021-12-01 DIAGNOSIS — M21061 Valgus deformity, not elsewhere classified, right knee: Secondary | ICD-10-CM | POA: Diagnosis not present

## 2021-12-01 DIAGNOSIS — Z7984 Long term (current) use of oral hypoglycemic drugs: Secondary | ICD-10-CM | POA: Diagnosis not present

## 2021-12-01 DIAGNOSIS — Z96651 Presence of right artificial knee joint: Secondary | ICD-10-CM | POA: Diagnosis not present

## 2021-12-02 DIAGNOSIS — R2689 Other abnormalities of gait and mobility: Secondary | ICD-10-CM | POA: Diagnosis not present

## 2021-12-02 DIAGNOSIS — M25561 Pain in right knee: Secondary | ICD-10-CM | POA: Diagnosis not present

## 2021-12-02 DIAGNOSIS — Z96651 Presence of right artificial knee joint: Secondary | ICD-10-CM | POA: Diagnosis not present

## 2021-12-02 DIAGNOSIS — M6281 Muscle weakness (generalized): Secondary | ICD-10-CM | POA: Diagnosis not present

## 2021-12-03 DIAGNOSIS — M25561 Pain in right knee: Secondary | ICD-10-CM | POA: Diagnosis not present

## 2021-12-03 DIAGNOSIS — Z96651 Presence of right artificial knee joint: Secondary | ICD-10-CM | POA: Diagnosis not present

## 2021-12-03 DIAGNOSIS — M6281 Muscle weakness (generalized): Secondary | ICD-10-CM | POA: Diagnosis not present

## 2021-12-03 DIAGNOSIS — R2689 Other abnormalities of gait and mobility: Secondary | ICD-10-CM | POA: Diagnosis not present

## 2021-12-04 DIAGNOSIS — R2689 Other abnormalities of gait and mobility: Secondary | ICD-10-CM | POA: Diagnosis not present

## 2021-12-04 DIAGNOSIS — Z96651 Presence of right artificial knee joint: Secondary | ICD-10-CM | POA: Diagnosis not present

## 2021-12-04 DIAGNOSIS — M6281 Muscle weakness (generalized): Secondary | ICD-10-CM | POA: Diagnosis not present

## 2021-12-04 DIAGNOSIS — M25561 Pain in right knee: Secondary | ICD-10-CM | POA: Diagnosis not present

## 2021-12-06 DIAGNOSIS — M25561 Pain in right knee: Secondary | ICD-10-CM | POA: Diagnosis not present

## 2021-12-06 DIAGNOSIS — Z96651 Presence of right artificial knee joint: Secondary | ICD-10-CM | POA: Diagnosis not present

## 2021-12-06 DIAGNOSIS — R2689 Other abnormalities of gait and mobility: Secondary | ICD-10-CM | POA: Diagnosis not present

## 2021-12-06 DIAGNOSIS — M6281 Muscle weakness (generalized): Secondary | ICD-10-CM | POA: Diagnosis not present

## 2021-12-07 DIAGNOSIS — Z96651 Presence of right artificial knee joint: Secondary | ICD-10-CM | POA: Diagnosis not present

## 2021-12-07 DIAGNOSIS — R2689 Other abnormalities of gait and mobility: Secondary | ICD-10-CM | POA: Diagnosis not present

## 2021-12-07 DIAGNOSIS — M6281 Muscle weakness (generalized): Secondary | ICD-10-CM | POA: Diagnosis not present

## 2021-12-07 DIAGNOSIS — M25561 Pain in right knee: Secondary | ICD-10-CM | POA: Diagnosis not present

## 2021-12-08 DIAGNOSIS — Z96651 Presence of right artificial knee joint: Secondary | ICD-10-CM | POA: Diagnosis not present

## 2021-12-08 DIAGNOSIS — M25561 Pain in right knee: Secondary | ICD-10-CM | POA: Diagnosis not present

## 2021-12-08 DIAGNOSIS — M6281 Muscle weakness (generalized): Secondary | ICD-10-CM | POA: Diagnosis not present

## 2021-12-08 DIAGNOSIS — R2689 Other abnormalities of gait and mobility: Secondary | ICD-10-CM | POA: Diagnosis not present

## 2021-12-11 DIAGNOSIS — R2689 Other abnormalities of gait and mobility: Secondary | ICD-10-CM | POA: Diagnosis not present

## 2021-12-11 DIAGNOSIS — M25561 Pain in right knee: Secondary | ICD-10-CM | POA: Diagnosis not present

## 2021-12-11 DIAGNOSIS — Z96651 Presence of right artificial knee joint: Secondary | ICD-10-CM | POA: Diagnosis not present

## 2021-12-11 DIAGNOSIS — M6281 Muscle weakness (generalized): Secondary | ICD-10-CM | POA: Diagnosis not present

## 2021-12-12 DIAGNOSIS — M6281 Muscle weakness (generalized): Secondary | ICD-10-CM | POA: Diagnosis not present

## 2021-12-12 DIAGNOSIS — M25561 Pain in right knee: Secondary | ICD-10-CM | POA: Diagnosis not present

## 2021-12-12 DIAGNOSIS — Z96651 Presence of right artificial knee joint: Secondary | ICD-10-CM | POA: Diagnosis not present

## 2021-12-12 DIAGNOSIS — R2689 Other abnormalities of gait and mobility: Secondary | ICD-10-CM | POA: Diagnosis not present

## 2021-12-13 DIAGNOSIS — Z96651 Presence of right artificial knee joint: Secondary | ICD-10-CM | POA: Diagnosis not present

## 2021-12-13 DIAGNOSIS — R2689 Other abnormalities of gait and mobility: Secondary | ICD-10-CM | POA: Diagnosis not present

## 2021-12-13 DIAGNOSIS — M25561 Pain in right knee: Secondary | ICD-10-CM | POA: Diagnosis not present

## 2021-12-13 DIAGNOSIS — M6281 Muscle weakness (generalized): Secondary | ICD-10-CM | POA: Diagnosis not present

## 2021-12-15 DIAGNOSIS — M25561 Pain in right knee: Secondary | ICD-10-CM | POA: Diagnosis not present

## 2021-12-15 DIAGNOSIS — Z96651 Presence of right artificial knee joint: Secondary | ICD-10-CM | POA: Diagnosis not present

## 2021-12-15 DIAGNOSIS — R2689 Other abnormalities of gait and mobility: Secondary | ICD-10-CM | POA: Diagnosis not present

## 2021-12-15 DIAGNOSIS — M6281 Muscle weakness (generalized): Secondary | ICD-10-CM | POA: Diagnosis not present

## 2021-12-16 DIAGNOSIS — Z471 Aftercare following joint replacement surgery: Secondary | ICD-10-CM | POA: Diagnosis not present

## 2021-12-16 DIAGNOSIS — M1712 Unilateral primary osteoarthritis, left knee: Secondary | ICD-10-CM | POA: Diagnosis not present

## 2021-12-16 DIAGNOSIS — Z0189 Encounter for other specified special examinations: Secondary | ICD-10-CM | POA: Diagnosis not present

## 2021-12-16 DIAGNOSIS — Z96651 Presence of right artificial knee joint: Secondary | ICD-10-CM | POA: Diagnosis not present

## 2021-12-17 DIAGNOSIS — M25561 Pain in right knee: Secondary | ICD-10-CM | POA: Diagnosis not present

## 2021-12-17 DIAGNOSIS — R2689 Other abnormalities of gait and mobility: Secondary | ICD-10-CM | POA: Diagnosis not present

## 2021-12-17 DIAGNOSIS — M6281 Muscle weakness (generalized): Secondary | ICD-10-CM | POA: Diagnosis not present

## 2021-12-17 DIAGNOSIS — Z96651 Presence of right artificial knee joint: Secondary | ICD-10-CM | POA: Diagnosis not present

## 2021-12-19 DIAGNOSIS — M6281 Muscle weakness (generalized): Secondary | ICD-10-CM | POA: Diagnosis not present

## 2021-12-19 DIAGNOSIS — R2689 Other abnormalities of gait and mobility: Secondary | ICD-10-CM | POA: Diagnosis not present

## 2021-12-19 DIAGNOSIS — M25561 Pain in right knee: Secondary | ICD-10-CM | POA: Diagnosis not present

## 2021-12-19 DIAGNOSIS — Z96651 Presence of right artificial knee joint: Secondary | ICD-10-CM | POA: Diagnosis not present

## 2021-12-22 DIAGNOSIS — M25561 Pain in right knee: Secondary | ICD-10-CM | POA: Diagnosis not present

## 2021-12-22 DIAGNOSIS — M6281 Muscle weakness (generalized): Secondary | ICD-10-CM | POA: Diagnosis not present

## 2021-12-22 DIAGNOSIS — Z96651 Presence of right artificial knee joint: Secondary | ICD-10-CM | POA: Diagnosis not present

## 2021-12-22 DIAGNOSIS — R2689 Other abnormalities of gait and mobility: Secondary | ICD-10-CM | POA: Diagnosis not present

## 2021-12-24 DIAGNOSIS — R2689 Other abnormalities of gait and mobility: Secondary | ICD-10-CM | POA: Diagnosis not present

## 2021-12-24 DIAGNOSIS — M25561 Pain in right knee: Secondary | ICD-10-CM | POA: Diagnosis not present

## 2021-12-24 DIAGNOSIS — M6281 Muscle weakness (generalized): Secondary | ICD-10-CM | POA: Diagnosis not present

## 2021-12-24 DIAGNOSIS — Z96651 Presence of right artificial knee joint: Secondary | ICD-10-CM | POA: Diagnosis not present

## 2021-12-26 DIAGNOSIS — Z96651 Presence of right artificial knee joint: Secondary | ICD-10-CM | POA: Diagnosis not present

## 2021-12-26 DIAGNOSIS — M6281 Muscle weakness (generalized): Secondary | ICD-10-CM | POA: Diagnosis not present

## 2021-12-26 DIAGNOSIS — M25561 Pain in right knee: Secondary | ICD-10-CM | POA: Diagnosis not present

## 2021-12-26 DIAGNOSIS — R2689 Other abnormalities of gait and mobility: Secondary | ICD-10-CM | POA: Diagnosis not present

## 2021-12-30 DIAGNOSIS — M6281 Muscle weakness (generalized): Secondary | ICD-10-CM | POA: Diagnosis not present

## 2021-12-30 DIAGNOSIS — M25561 Pain in right knee: Secondary | ICD-10-CM | POA: Diagnosis not present

## 2021-12-30 DIAGNOSIS — R2689 Other abnormalities of gait and mobility: Secondary | ICD-10-CM | POA: Diagnosis not present

## 2021-12-30 DIAGNOSIS — Z96651 Presence of right artificial knee joint: Secondary | ICD-10-CM | POA: Diagnosis not present

## 2022-01-01 DIAGNOSIS — Z96651 Presence of right artificial knee joint: Secondary | ICD-10-CM | POA: Diagnosis not present

## 2022-01-01 DIAGNOSIS — R2689 Other abnormalities of gait and mobility: Secondary | ICD-10-CM | POA: Diagnosis not present

## 2022-01-01 DIAGNOSIS — M25561 Pain in right knee: Secondary | ICD-10-CM | POA: Diagnosis not present

## 2022-01-01 DIAGNOSIS — M6281 Muscle weakness (generalized): Secondary | ICD-10-CM | POA: Diagnosis not present

## 2022-01-02 DIAGNOSIS — R2689 Other abnormalities of gait and mobility: Secondary | ICD-10-CM | POA: Diagnosis not present

## 2022-01-02 DIAGNOSIS — Z96651 Presence of right artificial knee joint: Secondary | ICD-10-CM | POA: Diagnosis not present

## 2022-01-02 DIAGNOSIS — M6281 Muscle weakness (generalized): Secondary | ICD-10-CM | POA: Diagnosis not present

## 2022-01-02 DIAGNOSIS — M25561 Pain in right knee: Secondary | ICD-10-CM | POA: Diagnosis not present

## 2022-01-05 DIAGNOSIS — M6281 Muscle weakness (generalized): Secondary | ICD-10-CM | POA: Diagnosis not present

## 2022-01-05 DIAGNOSIS — Z96651 Presence of right artificial knee joint: Secondary | ICD-10-CM | POA: Diagnosis not present

## 2022-01-05 DIAGNOSIS — M25561 Pain in right knee: Secondary | ICD-10-CM | POA: Diagnosis not present

## 2022-01-05 DIAGNOSIS — R2689 Other abnormalities of gait and mobility: Secondary | ICD-10-CM | POA: Diagnosis not present

## 2022-01-06 DIAGNOSIS — C44311 Basal cell carcinoma of skin of nose: Secondary | ICD-10-CM | POA: Diagnosis not present

## 2022-01-09 DIAGNOSIS — M25561 Pain in right knee: Secondary | ICD-10-CM | POA: Diagnosis not present

## 2022-01-09 DIAGNOSIS — M6281 Muscle weakness (generalized): Secondary | ICD-10-CM | POA: Diagnosis not present

## 2022-01-09 DIAGNOSIS — R2689 Other abnormalities of gait and mobility: Secondary | ICD-10-CM | POA: Diagnosis not present

## 2022-01-09 DIAGNOSIS — Z96651 Presence of right artificial knee joint: Secondary | ICD-10-CM | POA: Diagnosis not present

## 2022-01-13 DIAGNOSIS — M6281 Muscle weakness (generalized): Secondary | ICD-10-CM | POA: Diagnosis not present

## 2022-01-13 DIAGNOSIS — R2689 Other abnormalities of gait and mobility: Secondary | ICD-10-CM | POA: Diagnosis not present

## 2022-01-13 DIAGNOSIS — Z96651 Presence of right artificial knee joint: Secondary | ICD-10-CM | POA: Diagnosis not present

## 2022-01-13 DIAGNOSIS — M25561 Pain in right knee: Secondary | ICD-10-CM | POA: Diagnosis not present

## 2022-01-14 DIAGNOSIS — R2689 Other abnormalities of gait and mobility: Secondary | ICD-10-CM | POA: Diagnosis not present

## 2022-01-14 DIAGNOSIS — Z96651 Presence of right artificial knee joint: Secondary | ICD-10-CM | POA: Diagnosis not present

## 2022-01-14 DIAGNOSIS — M25561 Pain in right knee: Secondary | ICD-10-CM | POA: Diagnosis not present

## 2022-01-14 DIAGNOSIS — M6281 Muscle weakness (generalized): Secondary | ICD-10-CM | POA: Diagnosis not present

## 2022-01-16 DIAGNOSIS — M25561 Pain in right knee: Secondary | ICD-10-CM | POA: Diagnosis not present

## 2022-01-16 DIAGNOSIS — R2689 Other abnormalities of gait and mobility: Secondary | ICD-10-CM | POA: Diagnosis not present

## 2022-01-16 DIAGNOSIS — Z96651 Presence of right artificial knee joint: Secondary | ICD-10-CM | POA: Diagnosis not present

## 2022-01-16 DIAGNOSIS — M6281 Muscle weakness (generalized): Secondary | ICD-10-CM | POA: Diagnosis not present

## 2022-01-27 DIAGNOSIS — E1151 Type 2 diabetes mellitus with diabetic peripheral angiopathy without gangrene: Secondary | ICD-10-CM | POA: Diagnosis not present

## 2022-01-27 DIAGNOSIS — M1712 Unilateral primary osteoarthritis, left knee: Secondary | ICD-10-CM | POA: Diagnosis not present

## 2022-01-27 DIAGNOSIS — E1140 Type 2 diabetes mellitus with diabetic neuropathy, unspecified: Secondary | ICD-10-CM | POA: Diagnosis not present

## 2022-01-27 DIAGNOSIS — Z01818 Encounter for other preprocedural examination: Secondary | ICD-10-CM | POA: Diagnosis not present

## 2022-02-06 DIAGNOSIS — Z01818 Encounter for other preprocedural examination: Secondary | ICD-10-CM | POA: Diagnosis not present

## 2022-02-06 DIAGNOSIS — M1712 Unilateral primary osteoarthritis, left knee: Secondary | ICD-10-CM | POA: Diagnosis not present

## 2022-02-26 DIAGNOSIS — I4819 Other persistent atrial fibrillation: Secondary | ICD-10-CM | POA: Diagnosis not present

## 2022-02-26 DIAGNOSIS — M21062 Valgus deformity, not elsewhere classified, left knee: Secondary | ICD-10-CM | POA: Diagnosis not present

## 2022-02-26 DIAGNOSIS — Z87442 Personal history of urinary calculi: Secondary | ICD-10-CM | POA: Diagnosis not present

## 2022-02-26 DIAGNOSIS — M1712 Unilateral primary osteoarthritis, left knee: Secondary | ICD-10-CM | POA: Diagnosis not present

## 2022-02-26 DIAGNOSIS — Z9049 Acquired absence of other specified parts of digestive tract: Secondary | ICD-10-CM | POA: Diagnosis not present

## 2022-02-26 DIAGNOSIS — E7800 Pure hypercholesterolemia, unspecified: Secondary | ICD-10-CM | POA: Diagnosis not present

## 2022-02-26 DIAGNOSIS — Z7901 Long term (current) use of anticoagulants: Secondary | ICD-10-CM | POA: Diagnosis not present

## 2022-02-26 DIAGNOSIS — I10 Essential (primary) hypertension: Secondary | ICD-10-CM | POA: Diagnosis not present

## 2022-02-26 DIAGNOSIS — N400 Enlarged prostate without lower urinary tract symptoms: Secondary | ICD-10-CM | POA: Diagnosis not present

## 2022-02-26 DIAGNOSIS — H547 Unspecified visual loss: Secondary | ICD-10-CM | POA: Diagnosis not present

## 2022-02-26 DIAGNOSIS — Z96652 Presence of left artificial knee joint: Secondary | ICD-10-CM | POA: Diagnosis not present

## 2022-02-26 DIAGNOSIS — Z85828 Personal history of other malignant neoplasm of skin: Secondary | ICD-10-CM | POA: Diagnosis not present

## 2022-02-26 DIAGNOSIS — Z7984 Long term (current) use of oral hypoglycemic drugs: Secondary | ICD-10-CM | POA: Diagnosis not present

## 2022-02-26 DIAGNOSIS — E1136 Type 2 diabetes mellitus with diabetic cataract: Secondary | ICD-10-CM | POA: Diagnosis not present

## 2022-02-26 DIAGNOSIS — E1140 Type 2 diabetes mellitus with diabetic neuropathy, unspecified: Secondary | ICD-10-CM | POA: Diagnosis not present

## 2022-02-26 DIAGNOSIS — Z8673 Personal history of transient ischemic attack (TIA), and cerebral infarction without residual deficits: Secondary | ICD-10-CM | POA: Diagnosis not present

## 2022-02-27 DIAGNOSIS — Z96652 Presence of left artificial knee joint: Secondary | ICD-10-CM | POA: Diagnosis not present

## 2022-02-27 DIAGNOSIS — M6281 Muscle weakness (generalized): Secondary | ICD-10-CM | POA: Diagnosis not present

## 2022-03-03 DIAGNOSIS — M6281 Muscle weakness (generalized): Secondary | ICD-10-CM | POA: Diagnosis not present

## 2022-03-03 DIAGNOSIS — Z96652 Presence of left artificial knee joint: Secondary | ICD-10-CM | POA: Diagnosis not present

## 2022-03-04 DIAGNOSIS — M6281 Muscle weakness (generalized): Secondary | ICD-10-CM | POA: Diagnosis not present

## 2022-03-04 DIAGNOSIS — Z96652 Presence of left artificial knee joint: Secondary | ICD-10-CM | POA: Diagnosis not present

## 2022-03-05 DIAGNOSIS — Z96652 Presence of left artificial knee joint: Secondary | ICD-10-CM | POA: Diagnosis not present

## 2022-03-05 DIAGNOSIS — M6281 Muscle weakness (generalized): Secondary | ICD-10-CM | POA: Diagnosis not present

## 2022-03-06 DIAGNOSIS — Z96652 Presence of left artificial knee joint: Secondary | ICD-10-CM | POA: Diagnosis not present

## 2022-03-06 DIAGNOSIS — M6281 Muscle weakness (generalized): Secondary | ICD-10-CM | POA: Diagnosis not present

## 2022-03-07 DIAGNOSIS — Z96652 Presence of left artificial knee joint: Secondary | ICD-10-CM | POA: Diagnosis not present

## 2022-03-07 DIAGNOSIS — M6281 Muscle weakness (generalized): Secondary | ICD-10-CM | POA: Diagnosis not present

## 2022-03-09 DIAGNOSIS — M6281 Muscle weakness (generalized): Secondary | ICD-10-CM | POA: Diagnosis not present

## 2022-03-09 DIAGNOSIS — Z96652 Presence of left artificial knee joint: Secondary | ICD-10-CM | POA: Diagnosis not present

## 2022-03-10 DIAGNOSIS — Z96652 Presence of left artificial knee joint: Secondary | ICD-10-CM | POA: Diagnosis not present

## 2022-03-10 DIAGNOSIS — M6281 Muscle weakness (generalized): Secondary | ICD-10-CM | POA: Diagnosis not present

## 2022-03-11 DIAGNOSIS — M6281 Muscle weakness (generalized): Secondary | ICD-10-CM | POA: Diagnosis not present

## 2022-03-11 DIAGNOSIS — Z96652 Presence of left artificial knee joint: Secondary | ICD-10-CM | POA: Diagnosis not present

## 2022-03-12 DIAGNOSIS — Z96652 Presence of left artificial knee joint: Secondary | ICD-10-CM | POA: Diagnosis not present

## 2022-03-12 DIAGNOSIS — M6281 Muscle weakness (generalized): Secondary | ICD-10-CM | POA: Diagnosis not present

## 2022-03-13 DIAGNOSIS — Z0189 Encounter for other specified special examinations: Secondary | ICD-10-CM | POA: Diagnosis not present

## 2022-03-13 DIAGNOSIS — Z471 Aftercare following joint replacement surgery: Secondary | ICD-10-CM | POA: Diagnosis not present

## 2022-03-13 DIAGNOSIS — Z96652 Presence of left artificial knee joint: Secondary | ICD-10-CM | POA: Diagnosis not present

## 2022-03-16 DIAGNOSIS — M6281 Muscle weakness (generalized): Secondary | ICD-10-CM | POA: Diagnosis not present

## 2022-03-16 DIAGNOSIS — Z96652 Presence of left artificial knee joint: Secondary | ICD-10-CM | POA: Diagnosis not present

## 2022-03-17 DIAGNOSIS — Z96652 Presence of left artificial knee joint: Secondary | ICD-10-CM | POA: Diagnosis not present

## 2022-03-17 DIAGNOSIS — M6281 Muscle weakness (generalized): Secondary | ICD-10-CM | POA: Diagnosis not present

## 2022-03-19 DIAGNOSIS — Z96652 Presence of left artificial knee joint: Secondary | ICD-10-CM | POA: Diagnosis not present

## 2022-03-19 DIAGNOSIS — M6281 Muscle weakness (generalized): Secondary | ICD-10-CM | POA: Diagnosis not present

## 2022-03-23 DIAGNOSIS — Z96652 Presence of left artificial knee joint: Secondary | ICD-10-CM | POA: Diagnosis not present

## 2022-03-23 DIAGNOSIS — M6281 Muscle weakness (generalized): Secondary | ICD-10-CM | POA: Diagnosis not present

## 2022-03-25 DIAGNOSIS — M6281 Muscle weakness (generalized): Secondary | ICD-10-CM | POA: Diagnosis not present

## 2022-03-25 DIAGNOSIS — Z96652 Presence of left artificial knee joint: Secondary | ICD-10-CM | POA: Diagnosis not present

## 2022-03-27 DIAGNOSIS — Z96652 Presence of left artificial knee joint: Secondary | ICD-10-CM | POA: Diagnosis not present

## 2022-03-27 DIAGNOSIS — M6281 Muscle weakness (generalized): Secondary | ICD-10-CM | POA: Diagnosis not present

## 2022-03-30 DIAGNOSIS — Z96652 Presence of left artificial knee joint: Secondary | ICD-10-CM | POA: Diagnosis not present

## 2022-03-30 DIAGNOSIS — M6281 Muscle weakness (generalized): Secondary | ICD-10-CM | POA: Diagnosis not present

## 2022-04-01 DIAGNOSIS — Z96652 Presence of left artificial knee joint: Secondary | ICD-10-CM | POA: Diagnosis not present

## 2022-04-01 DIAGNOSIS — M6281 Muscle weakness (generalized): Secondary | ICD-10-CM | POA: Diagnosis not present

## 2022-04-02 DIAGNOSIS — I10 Essential (primary) hypertension: Secondary | ICD-10-CM | POA: Diagnosis not present

## 2022-04-02 DIAGNOSIS — E7849 Other hyperlipidemia: Secondary | ICD-10-CM | POA: Diagnosis not present

## 2022-04-02 DIAGNOSIS — E782 Mixed hyperlipidemia: Secondary | ICD-10-CM | POA: Diagnosis not present

## 2022-04-02 DIAGNOSIS — E1122 Type 2 diabetes mellitus with diabetic chronic kidney disease: Secondary | ICD-10-CM | POA: Diagnosis not present

## 2022-04-02 DIAGNOSIS — E876 Hypokalemia: Secondary | ICD-10-CM | POA: Diagnosis not present

## 2022-04-02 DIAGNOSIS — E1165 Type 2 diabetes mellitus with hyperglycemia: Secondary | ICD-10-CM | POA: Diagnosis not present

## 2022-04-03 DIAGNOSIS — Z96652 Presence of left artificial knee joint: Secondary | ICD-10-CM | POA: Diagnosis not present

## 2022-04-03 DIAGNOSIS — M6281 Muscle weakness (generalized): Secondary | ICD-10-CM | POA: Diagnosis not present

## 2022-04-07 DIAGNOSIS — Z96652 Presence of left artificial knee joint: Secondary | ICD-10-CM | POA: Diagnosis not present

## 2022-04-07 DIAGNOSIS — M6281 Muscle weakness (generalized): Secondary | ICD-10-CM | POA: Diagnosis not present

## 2022-04-08 DIAGNOSIS — Z96652 Presence of left artificial knee joint: Secondary | ICD-10-CM | POA: Diagnosis not present

## 2022-04-08 DIAGNOSIS — M6281 Muscle weakness (generalized): Secondary | ICD-10-CM | POA: Diagnosis not present

## 2022-04-10 DIAGNOSIS — Z96652 Presence of left artificial knee joint: Secondary | ICD-10-CM | POA: Diagnosis not present

## 2022-04-10 DIAGNOSIS — E6609 Other obesity due to excess calories: Secondary | ICD-10-CM | POA: Diagnosis not present

## 2022-04-10 DIAGNOSIS — I10 Essential (primary) hypertension: Secondary | ICD-10-CM | POA: Diagnosis not present

## 2022-04-10 DIAGNOSIS — E1142 Type 2 diabetes mellitus with diabetic polyneuropathy: Secondary | ICD-10-CM | POA: Diagnosis not present

## 2022-04-10 DIAGNOSIS — I700 Atherosclerosis of aorta: Secondary | ICD-10-CM | POA: Diagnosis not present

## 2022-04-10 DIAGNOSIS — Z6836 Body mass index (BMI) 36.0-36.9, adult: Secondary | ICD-10-CM | POA: Diagnosis not present

## 2022-04-10 DIAGNOSIS — N200 Calculus of kidney: Secondary | ICD-10-CM | POA: Diagnosis not present

## 2022-04-10 DIAGNOSIS — Z8673 Personal history of transient ischemic attack (TIA), and cerebral infarction without residual deficits: Secondary | ICD-10-CM | POA: Diagnosis not present

## 2022-04-10 DIAGNOSIS — I480 Paroxysmal atrial fibrillation: Secondary | ICD-10-CM | POA: Diagnosis not present

## 2022-04-10 DIAGNOSIS — E7849 Other hyperlipidemia: Secondary | ICD-10-CM | POA: Diagnosis not present

## 2022-04-10 DIAGNOSIS — M6281 Muscle weakness (generalized): Secondary | ICD-10-CM | POA: Diagnosis not present

## 2022-04-10 DIAGNOSIS — Z23 Encounter for immunization: Secondary | ICD-10-CM | POA: Diagnosis not present

## 2022-04-13 DIAGNOSIS — E1140 Type 2 diabetes mellitus with diabetic neuropathy, unspecified: Secondary | ICD-10-CM | POA: Diagnosis not present

## 2022-04-13 DIAGNOSIS — E1151 Type 2 diabetes mellitus with diabetic peripheral angiopathy without gangrene: Secondary | ICD-10-CM | POA: Diagnosis not present

## 2022-04-14 DIAGNOSIS — Z96652 Presence of left artificial knee joint: Secondary | ICD-10-CM | POA: Diagnosis not present

## 2022-04-14 DIAGNOSIS — M6281 Muscle weakness (generalized): Secondary | ICD-10-CM | POA: Diagnosis not present

## 2022-04-17 DIAGNOSIS — M6281 Muscle weakness (generalized): Secondary | ICD-10-CM | POA: Diagnosis not present

## 2022-04-17 DIAGNOSIS — Z96652 Presence of left artificial knee joint: Secondary | ICD-10-CM | POA: Diagnosis not present

## 2022-04-22 DIAGNOSIS — M6281 Muscle weakness (generalized): Secondary | ICD-10-CM | POA: Diagnosis not present

## 2022-04-22 DIAGNOSIS — Z96652 Presence of left artificial knee joint: Secondary | ICD-10-CM | POA: Diagnosis not present

## 2022-04-24 DIAGNOSIS — Z96652 Presence of left artificial knee joint: Secondary | ICD-10-CM | POA: Diagnosis not present

## 2022-04-24 DIAGNOSIS — M6281 Muscle weakness (generalized): Secondary | ICD-10-CM | POA: Diagnosis not present

## 2022-05-11 DIAGNOSIS — D1801 Hemangioma of skin and subcutaneous tissue: Secondary | ICD-10-CM | POA: Diagnosis not present

## 2022-05-11 DIAGNOSIS — L989 Disorder of the skin and subcutaneous tissue, unspecified: Secondary | ICD-10-CM | POA: Diagnosis not present

## 2022-05-11 DIAGNOSIS — L821 Other seborrheic keratosis: Secondary | ICD-10-CM | POA: Diagnosis not present

## 2022-05-11 DIAGNOSIS — Z85828 Personal history of other malignant neoplasm of skin: Secondary | ICD-10-CM | POA: Diagnosis not present

## 2022-05-11 DIAGNOSIS — Z08 Encounter for follow-up examination after completed treatment for malignant neoplasm: Secondary | ICD-10-CM | POA: Diagnosis not present

## 2022-05-11 DIAGNOSIS — D485 Neoplasm of uncertain behavior of skin: Secondary | ICD-10-CM | POA: Diagnosis not present

## 2022-05-11 DIAGNOSIS — L814 Other melanin hyperpigmentation: Secondary | ICD-10-CM | POA: Diagnosis not present

## 2022-06-02 DIAGNOSIS — I10 Essential (primary) hypertension: Secondary | ICD-10-CM | POA: Diagnosis not present

## 2022-06-02 DIAGNOSIS — E782 Mixed hyperlipidemia: Secondary | ICD-10-CM | POA: Diagnosis not present

## 2022-06-02 DIAGNOSIS — I480 Paroxysmal atrial fibrillation: Secondary | ICD-10-CM | POA: Diagnosis not present

## 2022-06-02 DIAGNOSIS — E1122 Type 2 diabetes mellitus with diabetic chronic kidney disease: Secondary | ICD-10-CM | POA: Diagnosis not present

## 2022-06-05 DIAGNOSIS — H25813 Combined forms of age-related cataract, bilateral: Secondary | ICD-10-CM | POA: Diagnosis not present

## 2022-06-19 DIAGNOSIS — L570 Actinic keratosis: Secondary | ICD-10-CM | POA: Diagnosis not present

## 2022-06-19 DIAGNOSIS — L905 Scar conditions and fibrosis of skin: Secondary | ICD-10-CM | POA: Diagnosis not present

## 2022-06-22 DIAGNOSIS — E1140 Type 2 diabetes mellitus with diabetic neuropathy, unspecified: Secondary | ICD-10-CM | POA: Diagnosis not present

## 2022-06-22 DIAGNOSIS — E1151 Type 2 diabetes mellitus with diabetic peripheral angiopathy without gangrene: Secondary | ICD-10-CM | POA: Diagnosis not present

## 2022-08-12 DIAGNOSIS — E039 Hypothyroidism, unspecified: Secondary | ICD-10-CM | POA: Diagnosis not present

## 2022-08-12 DIAGNOSIS — E1129 Type 2 diabetes mellitus with other diabetic kidney complication: Secondary | ICD-10-CM | POA: Diagnosis not present

## 2022-08-12 DIAGNOSIS — I10 Essential (primary) hypertension: Secondary | ICD-10-CM | POA: Diagnosis not present

## 2022-08-12 DIAGNOSIS — E876 Hypokalemia: Secondary | ICD-10-CM | POA: Diagnosis not present

## 2022-08-12 DIAGNOSIS — E7849 Other hyperlipidemia: Secondary | ICD-10-CM | POA: Diagnosis not present

## 2022-08-19 DIAGNOSIS — N200 Calculus of kidney: Secondary | ICD-10-CM | POA: Diagnosis not present

## 2022-08-19 DIAGNOSIS — Z6839 Body mass index (BMI) 39.0-39.9, adult: Secondary | ICD-10-CM | POA: Diagnosis not present

## 2022-08-19 DIAGNOSIS — I480 Paroxysmal atrial fibrillation: Secondary | ICD-10-CM | POA: Diagnosis not present

## 2022-08-19 DIAGNOSIS — Z8673 Personal history of transient ischemic attack (TIA), and cerebral infarction without residual deficits: Secondary | ICD-10-CM | POA: Diagnosis not present

## 2022-08-19 DIAGNOSIS — I10 Essential (primary) hypertension: Secondary | ICD-10-CM | POA: Diagnosis not present

## 2022-08-19 DIAGNOSIS — E7849 Other hyperlipidemia: Secondary | ICD-10-CM | POA: Diagnosis not present

## 2022-08-19 DIAGNOSIS — E6609 Other obesity due to excess calories: Secondary | ICD-10-CM | POA: Diagnosis not present

## 2022-08-19 DIAGNOSIS — Z0001 Encounter for general adult medical examination with abnormal findings: Secondary | ICD-10-CM | POA: Diagnosis not present

## 2022-08-19 DIAGNOSIS — I700 Atherosclerosis of aorta: Secondary | ICD-10-CM | POA: Diagnosis not present

## 2022-08-19 DIAGNOSIS — E1142 Type 2 diabetes mellitus with diabetic polyneuropathy: Secondary | ICD-10-CM | POA: Diagnosis not present

## 2022-08-19 DIAGNOSIS — Z23 Encounter for immunization: Secondary | ICD-10-CM | POA: Diagnosis not present

## 2022-08-31 DIAGNOSIS — E1140 Type 2 diabetes mellitus with diabetic neuropathy, unspecified: Secondary | ICD-10-CM | POA: Diagnosis not present

## 2022-08-31 DIAGNOSIS — E1151 Type 2 diabetes mellitus with diabetic peripheral angiopathy without gangrene: Secondary | ICD-10-CM | POA: Diagnosis not present

## 2022-09-02 DIAGNOSIS — I480 Paroxysmal atrial fibrillation: Secondary | ICD-10-CM | POA: Diagnosis not present

## 2022-09-02 DIAGNOSIS — I10 Essential (primary) hypertension: Secondary | ICD-10-CM | POA: Diagnosis not present

## 2022-09-02 DIAGNOSIS — E782 Mixed hyperlipidemia: Secondary | ICD-10-CM | POA: Diagnosis not present

## 2022-09-02 DIAGNOSIS — E1122 Type 2 diabetes mellitus with diabetic chronic kidney disease: Secondary | ICD-10-CM | POA: Diagnosis not present

## 2022-11-16 DIAGNOSIS — E1151 Type 2 diabetes mellitus with diabetic peripheral angiopathy without gangrene: Secondary | ICD-10-CM | POA: Diagnosis not present

## 2022-11-16 DIAGNOSIS — E1140 Type 2 diabetes mellitus with diabetic neuropathy, unspecified: Secondary | ICD-10-CM | POA: Diagnosis not present

## 2022-12-11 DIAGNOSIS — E7849 Other hyperlipidemia: Secondary | ICD-10-CM | POA: Diagnosis not present

## 2022-12-11 DIAGNOSIS — E1122 Type 2 diabetes mellitus with diabetic chronic kidney disease: Secondary | ICD-10-CM | POA: Diagnosis not present

## 2022-12-18 DIAGNOSIS — I700 Atherosclerosis of aorta: Secondary | ICD-10-CM | POA: Diagnosis not present

## 2022-12-18 DIAGNOSIS — I10 Essential (primary) hypertension: Secondary | ICD-10-CM | POA: Diagnosis not present

## 2022-12-18 DIAGNOSIS — E7849 Other hyperlipidemia: Secondary | ICD-10-CM | POA: Diagnosis not present

## 2022-12-18 DIAGNOSIS — Z8673 Personal history of transient ischemic attack (TIA), and cerebral infarction without residual deficits: Secondary | ICD-10-CM | POA: Diagnosis not present

## 2022-12-18 DIAGNOSIS — E6609 Other obesity due to excess calories: Secondary | ICD-10-CM | POA: Diagnosis not present

## 2022-12-18 DIAGNOSIS — E1142 Type 2 diabetes mellitus with diabetic polyneuropathy: Secondary | ICD-10-CM | POA: Diagnosis not present

## 2022-12-18 DIAGNOSIS — I480 Paroxysmal atrial fibrillation: Secondary | ICD-10-CM | POA: Diagnosis not present

## 2022-12-18 DIAGNOSIS — N200 Calculus of kidney: Secondary | ICD-10-CM | POA: Diagnosis not present

## 2022-12-18 DIAGNOSIS — Z6839 Body mass index (BMI) 39.0-39.9, adult: Secondary | ICD-10-CM | POA: Diagnosis not present

## 2023-02-15 DIAGNOSIS — E1140 Type 2 diabetes mellitus with diabetic neuropathy, unspecified: Secondary | ICD-10-CM | POA: Diagnosis not present

## 2023-02-15 DIAGNOSIS — E1151 Type 2 diabetes mellitus with diabetic peripheral angiopathy without gangrene: Secondary | ICD-10-CM | POA: Diagnosis not present

## 2023-04-02 DIAGNOSIS — I4891 Unspecified atrial fibrillation: Secondary | ICD-10-CM | POA: Diagnosis not present

## 2023-04-02 DIAGNOSIS — E782 Mixed hyperlipidemia: Secondary | ICD-10-CM | POA: Diagnosis not present

## 2023-04-02 DIAGNOSIS — I10 Essential (primary) hypertension: Secondary | ICD-10-CM | POA: Diagnosis not present

## 2023-04-02 DIAGNOSIS — E1122 Type 2 diabetes mellitus with diabetic chronic kidney disease: Secondary | ICD-10-CM | POA: Diagnosis not present

## 2023-04-07 DIAGNOSIS — E1122 Type 2 diabetes mellitus with diabetic chronic kidney disease: Secondary | ICD-10-CM | POA: Diagnosis not present

## 2023-04-07 DIAGNOSIS — E1142 Type 2 diabetes mellitus with diabetic polyneuropathy: Secondary | ICD-10-CM | POA: Diagnosis not present

## 2023-04-07 DIAGNOSIS — E1129 Type 2 diabetes mellitus with other diabetic kidney complication: Secondary | ICD-10-CM | POA: Diagnosis not present

## 2023-04-07 DIAGNOSIS — E1165 Type 2 diabetes mellitus with hyperglycemia: Secondary | ICD-10-CM | POA: Diagnosis not present

## 2023-04-07 DIAGNOSIS — E1121 Type 2 diabetes mellitus with diabetic nephropathy: Secondary | ICD-10-CM | POA: Diagnosis not present

## 2023-04-07 DIAGNOSIS — I10 Essential (primary) hypertension: Secondary | ICD-10-CM | POA: Diagnosis not present

## 2023-04-07 DIAGNOSIS — E7849 Other hyperlipidemia: Secondary | ICD-10-CM | POA: Diagnosis not present

## 2023-04-14 DIAGNOSIS — Z6841 Body Mass Index (BMI) 40.0 and over, adult: Secondary | ICD-10-CM | POA: Diagnosis not present

## 2023-04-14 DIAGNOSIS — E7849 Other hyperlipidemia: Secondary | ICD-10-CM | POA: Diagnosis not present

## 2023-04-14 DIAGNOSIS — I10 Essential (primary) hypertension: Secondary | ICD-10-CM | POA: Diagnosis not present

## 2023-04-14 DIAGNOSIS — I480 Paroxysmal atrial fibrillation: Secondary | ICD-10-CM | POA: Diagnosis not present

## 2023-04-14 DIAGNOSIS — Z23 Encounter for immunization: Secondary | ICD-10-CM | POA: Diagnosis not present

## 2023-04-14 DIAGNOSIS — N200 Calculus of kidney: Secondary | ICD-10-CM | POA: Diagnosis not present

## 2023-04-14 DIAGNOSIS — E782 Mixed hyperlipidemia: Secondary | ICD-10-CM | POA: Diagnosis not present

## 2023-04-14 DIAGNOSIS — E1142 Type 2 diabetes mellitus with diabetic polyneuropathy: Secondary | ICD-10-CM | POA: Diagnosis not present

## 2023-04-14 DIAGNOSIS — E6609 Other obesity due to excess calories: Secondary | ICD-10-CM | POA: Diagnosis not present

## 2023-04-14 DIAGNOSIS — I700 Atherosclerosis of aorta: Secondary | ICD-10-CM | POA: Diagnosis not present

## 2023-04-14 DIAGNOSIS — Z8673 Personal history of transient ischemic attack (TIA), and cerebral infarction without residual deficits: Secondary | ICD-10-CM | POA: Diagnosis not present

## 2023-04-19 DIAGNOSIS — E1140 Type 2 diabetes mellitus with diabetic neuropathy, unspecified: Secondary | ICD-10-CM | POA: Diagnosis not present

## 2023-04-19 DIAGNOSIS — E1151 Type 2 diabetes mellitus with diabetic peripheral angiopathy without gangrene: Secondary | ICD-10-CM | POA: Diagnosis not present

## 2023-06-14 DIAGNOSIS — H25813 Combined forms of age-related cataract, bilateral: Secondary | ICD-10-CM | POA: Diagnosis not present

## 2023-06-22 DIAGNOSIS — L738 Other specified follicular disorders: Secondary | ICD-10-CM | POA: Diagnosis not present

## 2023-06-22 DIAGNOSIS — L03312 Cellulitis of back [any part except buttock]: Secondary | ICD-10-CM | POA: Diagnosis not present

## 2023-06-22 DIAGNOSIS — I10 Essential (primary) hypertension: Secondary | ICD-10-CM | POA: Diagnosis not present

## 2023-06-22 DIAGNOSIS — E119 Type 2 diabetes mellitus without complications: Secondary | ICD-10-CM | POA: Diagnosis not present

## 2023-06-28 DIAGNOSIS — E1151 Type 2 diabetes mellitus with diabetic peripheral angiopathy without gangrene: Secondary | ICD-10-CM | POA: Diagnosis not present

## 2023-06-28 DIAGNOSIS — E1140 Type 2 diabetes mellitus with diabetic neuropathy, unspecified: Secondary | ICD-10-CM | POA: Diagnosis not present

## 2023-08-11 DIAGNOSIS — D485 Neoplasm of uncertain behavior of skin: Secondary | ICD-10-CM | POA: Diagnosis not present

## 2023-08-11 DIAGNOSIS — D2362 Other benign neoplasm of skin of left upper limb, including shoulder: Secondary | ICD-10-CM | POA: Diagnosis not present

## 2023-08-11 DIAGNOSIS — L814 Other melanin hyperpigmentation: Secondary | ICD-10-CM | POA: Diagnosis not present

## 2023-08-11 DIAGNOSIS — Z08 Encounter for follow-up examination after completed treatment for malignant neoplasm: Secondary | ICD-10-CM | POA: Diagnosis not present

## 2023-08-11 DIAGNOSIS — D1801 Hemangioma of skin and subcutaneous tissue: Secondary | ICD-10-CM | POA: Diagnosis not present

## 2023-08-11 DIAGNOSIS — Z85828 Personal history of other malignant neoplasm of skin: Secondary | ICD-10-CM | POA: Diagnosis not present

## 2023-08-11 DIAGNOSIS — L821 Other seborrheic keratosis: Secondary | ICD-10-CM | POA: Diagnosis not present

## 2023-08-11 DIAGNOSIS — C4442 Squamous cell carcinoma of skin of scalp and neck: Secondary | ICD-10-CM | POA: Diagnosis not present

## 2023-08-11 DIAGNOSIS — Z872 Personal history of diseases of the skin and subcutaneous tissue: Secondary | ICD-10-CM | POA: Diagnosis not present

## 2023-08-20 DIAGNOSIS — E876 Hypokalemia: Secondary | ICD-10-CM | POA: Diagnosis not present

## 2023-08-20 DIAGNOSIS — E1122 Type 2 diabetes mellitus with diabetic chronic kidney disease: Secondary | ICD-10-CM | POA: Diagnosis not present

## 2023-08-20 DIAGNOSIS — E782 Mixed hyperlipidemia: Secondary | ICD-10-CM | POA: Diagnosis not present

## 2023-08-20 DIAGNOSIS — E7849 Other hyperlipidemia: Secondary | ICD-10-CM | POA: Diagnosis not present

## 2023-08-25 DIAGNOSIS — E782 Mixed hyperlipidemia: Secondary | ICD-10-CM | POA: Diagnosis not present

## 2023-08-25 DIAGNOSIS — I259 Chronic ischemic heart disease, unspecified: Secondary | ICD-10-CM | POA: Diagnosis not present

## 2023-08-25 DIAGNOSIS — I482 Chronic atrial fibrillation, unspecified: Secondary | ICD-10-CM | POA: Diagnosis not present

## 2023-08-25 DIAGNOSIS — Z6841 Body Mass Index (BMI) 40.0 and over, adult: Secondary | ICD-10-CM | POA: Diagnosis not present

## 2023-09-06 DIAGNOSIS — E1140 Type 2 diabetes mellitus with diabetic neuropathy, unspecified: Secondary | ICD-10-CM | POA: Diagnosis not present

## 2023-09-06 DIAGNOSIS — E1151 Type 2 diabetes mellitus with diabetic peripheral angiopathy without gangrene: Secondary | ICD-10-CM | POA: Diagnosis not present

## 2023-09-17 DIAGNOSIS — Z6841 Body Mass Index (BMI) 40.0 and over, adult: Secondary | ICD-10-CM | POA: Diagnosis not present

## 2023-09-17 DIAGNOSIS — Z0001 Encounter for general adult medical examination with abnormal findings: Secondary | ICD-10-CM | POA: Diagnosis not present

## 2023-10-01 DIAGNOSIS — I482 Chronic atrial fibrillation, unspecified: Secondary | ICD-10-CM | POA: Diagnosis not present

## 2023-10-01 DIAGNOSIS — E1122 Type 2 diabetes mellitus with diabetic chronic kidney disease: Secondary | ICD-10-CM | POA: Diagnosis not present

## 2023-10-01 DIAGNOSIS — E782 Mixed hyperlipidemia: Secondary | ICD-10-CM | POA: Diagnosis not present

## 2023-10-13 ENCOUNTER — Inpatient Hospital Stay (HOSPITAL_COMMUNITY): Admitting: Certified Registered"

## 2023-10-13 ENCOUNTER — Encounter (HOSPITAL_COMMUNITY)
Admission: AD | Disposition: A | Discharge status: Home / Self Care | Payer: Self-pay | Source: Ambulatory Visit | Attending: Urology

## 2023-10-13 ENCOUNTER — Inpatient Hospital Stay (HOSPITAL_COMMUNITY)

## 2023-10-13 ENCOUNTER — Inpatient Hospital Stay (HOSPITAL_BASED_OUTPATIENT_CLINIC_OR_DEPARTMENT_OTHER): Admitting: Certified Registered"

## 2023-10-13 ENCOUNTER — Other Ambulatory Visit: Payer: Self-pay | Admitting: Urology

## 2023-10-13 ENCOUNTER — Other Ambulatory Visit: Payer: Self-pay

## 2023-10-13 ENCOUNTER — Ambulatory Visit (HOSPITAL_COMMUNITY)
Admission: AD | Admit: 2023-10-13 | Discharge: 2023-10-13 | Disposition: A | Discharge status: Home / Self Care | Source: Ambulatory Visit | Attending: Urology | Admitting: Urology

## 2023-10-13 ENCOUNTER — Encounter (HOSPITAL_COMMUNITY): Payer: Self-pay | Admitting: Urology

## 2023-10-13 DIAGNOSIS — I679 Cerebrovascular disease, unspecified: Secondary | ICD-10-CM | POA: Diagnosis not present

## 2023-10-13 DIAGNOSIS — Z87442 Personal history of urinary calculi: Secondary | ICD-10-CM | POA: Diagnosis not present

## 2023-10-13 DIAGNOSIS — N179 Acute kidney failure, unspecified: Secondary | ICD-10-CM | POA: Diagnosis not present

## 2023-10-13 DIAGNOSIS — Z833 Family history of diabetes mellitus: Secondary | ICD-10-CM | POA: Diagnosis not present

## 2023-10-13 DIAGNOSIS — N210 Calculus in bladder: Secondary | ICD-10-CM | POA: Diagnosis not present

## 2023-10-13 DIAGNOSIS — N201 Calculus of ureter: Secondary | ICD-10-CM | POA: Diagnosis not present

## 2023-10-13 DIAGNOSIS — I2510 Atherosclerotic heart disease of native coronary artery without angina pectoris: Secondary | ICD-10-CM | POA: Insufficient documentation

## 2023-10-13 DIAGNOSIS — E119 Type 2 diabetes mellitus without complications: Secondary | ICD-10-CM | POA: Insufficient documentation

## 2023-10-13 DIAGNOSIS — Z9049 Acquired absence of other specified parts of digestive tract: Secondary | ICD-10-CM | POA: Diagnosis not present

## 2023-10-13 DIAGNOSIS — M1990 Unspecified osteoarthritis, unspecified site: Secondary | ICD-10-CM | POA: Insufficient documentation

## 2023-10-13 DIAGNOSIS — N132 Hydronephrosis with renal and ureteral calculous obstruction: Secondary | ICD-10-CM | POA: Diagnosis not present

## 2023-10-13 DIAGNOSIS — I10 Essential (primary) hypertension: Secondary | ICD-10-CM | POA: Diagnosis not present

## 2023-10-13 DIAGNOSIS — I420 Dilated cardiomyopathy: Secondary | ICD-10-CM | POA: Insufficient documentation

## 2023-10-13 DIAGNOSIS — R9341 Abnormal radiologic findings on diagnostic imaging of renal pelvis, ureter, or bladder: Secondary | ICD-10-CM | POA: Diagnosis not present

## 2023-10-13 DIAGNOSIS — N200 Calculus of kidney: Secondary | ICD-10-CM | POA: Diagnosis not present

## 2023-10-13 HISTORY — PX: CYSTOSCOPY W/ URETERAL STENT PLACEMENT: SHX1429

## 2023-10-13 HISTORY — PX: CYSTOSCOPY WITH LITHOLAPAXY: SHX1425

## 2023-10-13 LAB — COMPREHENSIVE METABOLIC PANEL
ALT: 14 U/L (ref 0–44)
AST: 24 U/L (ref 15–41)
Albumin: 3 g/dL — ABNORMAL LOW (ref 3.5–5.0)
Alkaline Phosphatase: 60 U/L (ref 38–126)
Anion gap: 13 (ref 5–15)
BUN: 33 mg/dL — ABNORMAL HIGH (ref 8–23)
CO2: 21 mmol/L — ABNORMAL LOW (ref 22–32)
Calcium: 8.4 mg/dL — ABNORMAL LOW (ref 8.9–10.3)
Chloride: 102 mmol/L (ref 98–111)
Creatinine, Ser: 2.16 mg/dL — ABNORMAL HIGH (ref 0.61–1.24)
GFR, Estimated: 30 mL/min — ABNORMAL LOW (ref >60)
Glucose, Bld: 206 mg/dL — ABNORMAL HIGH (ref 70–99)
Potassium: 3.7 mmol/L (ref 3.5–5.1)
Sodium: 136 mmol/L (ref 135–145)
Total Bilirubin: 2.5 mg/dL — ABNORMAL HIGH (ref 0.0–1.2)
Total Protein: 6.5 g/dL (ref 6.5–8.1)

## 2023-10-13 LAB — HEMOGLOBIN AND HEMATOCRIT, BLOOD
HCT: 42.1 % (ref 39.0–52.0)
Hemoglobin: 13.6 g/dL (ref 13.0–17.0)

## 2023-10-13 LAB — GLUCOSE, CAPILLARY
Glucose-Capillary: 225 mg/dL — ABNORMAL HIGH (ref 70–99)
Glucose-Capillary: 182 mg/dL — ABNORMAL HIGH (ref 70–99)

## 2023-10-13 SURGERY — CYSTOSCOPY, WITH RETROGRADE PYELOGRAM AND URETERAL STENT INSERTION
Anesthesia: General

## 2023-10-13 MED ORDER — 0.9 % SODIUM CHLORIDE (POUR BTL) OPTIME
TOPICAL | Status: DC | PRN
  Administered 2023-10-13: 1000 mL

## 2023-10-13 MED ORDER — ACETAMINOPHEN 10 MG/ML IV SOLN: 1000.0000 mg | Freq: Once | INTRAVENOUS | Status: DC | PRN

## 2023-10-13 MED ORDER — SUGAMMADEX SODIUM 200 MG/2ML IV SOLN
INTRAVENOUS | Status: DC | PRN
  Administered 2023-10-13: 200 mg via INTRAVENOUS

## 2023-10-13 MED ORDER — FENTANYL CITRATE (PF) 100 MCG/2ML IJ SOLN
INTRAMUSCULAR | Status: AC
Start: 2023-10-13 — End: ?
  Filled 2023-10-13: qty 2

## 2023-10-13 MED ORDER — SODIUM CHLORIDE 0.9 % IV SOLN
INTRAVENOUS | Status: AC
  Filled 2023-10-13: qty 20

## 2023-10-13 MED ORDER — ONDANSETRON HCL 4 MG/2ML IJ SOLN
INTRAMUSCULAR | Status: DC | PRN
  Administered 2023-10-13: 4 mg via INTRAVENOUS

## 2023-10-13 MED ORDER — CEFAZOLIN SODIUM-DEXTROSE 2-4 GM/100ML-% IV SOLN
INTRAVENOUS | Status: AC
  Filled 2023-10-13: qty 100

## 2023-10-13 MED ORDER — SODIUM CHLORIDE 0.9 % IR SOLN
Status: DC | PRN
  Administered 2023-10-13 (×4): 3000 mL via INTRAVESICAL

## 2023-10-13 MED ORDER — ACETAMINOPHEN 10 MG/ML IV SOLN
INTRAVENOUS | Status: AC
  Filled 2023-10-13: qty 100

## 2023-10-13 MED ORDER — PHENYLEPHRINE 80 MCG/ML (10ML) SYRINGE FOR IV PUSH (FOR BLOOD PRESSURE SUPPORT)
PREFILLED_SYRINGE | INTRAVENOUS | Status: DC | PRN
  Administered 2023-10-13: 160 ug via INTRAVENOUS
  Administered 2023-10-13: 80 ug via INTRAVENOUS
  Administered 2023-10-13 (×2): 240 ug via INTRAVENOUS
  Administered 2023-10-13: 80 ug via INTRAVENOUS

## 2023-10-13 MED ORDER — FENTANYL CITRATE PF 50 MCG/ML IJ SOSY: 25.0000 ug | PREFILLED_SYRINGE | INTRAMUSCULAR | Status: DC | PRN

## 2023-10-13 MED ORDER — PHENYLEPHRINE HCL-NACL 20-0.9 MG/250ML-% IV SOLN: INTRAVENOUS | Status: DC | PRN

## 2023-10-13 MED ORDER — METHOCARBAMOL 750 MG PO TABS: 750.0000 mg | ORAL_TABLET | Freq: Four times a day (QID) | ORAL | 0 refills | Status: AC

## 2023-10-13 MED ORDER — DEXTROSE 5 % IV SOLN
INTRAVENOUS | Status: DC | PRN
  Administered 2023-10-13: 2 g via INTRAVENOUS

## 2023-10-13 MED ORDER — ORAL CARE MOUTH RINSE: 15.0000 mL | Freq: Once | OROMUCOSAL | Status: AC

## 2023-10-13 MED ORDER — ACETAMINOPHEN 10 MG/ML IV SOLN
INTRAVENOUS | Status: DC | PRN
Start: 2023-10-13 — End: 2023-10-13
  Administered 2023-10-13: 1000 mg via INTRAVENOUS

## 2023-10-13 MED ORDER — CHLORHEXIDINE GLUCONATE 0.12 % MT SOLN
15.0000 mL | Freq: Once | OROMUCOSAL | Status: AC
  Administered 2023-10-13: 15 mL via OROMUCOSAL

## 2023-10-13 MED ORDER — LIDOCAINE 2% (20 MG/ML) 5 ML SYRINGE
INTRAMUSCULAR | Status: DC | PRN
  Administered 2023-10-13: 100 mg via INTRAVENOUS

## 2023-10-13 MED ORDER — PHENYLEPHRINE HCL-NACL 20-0.9 MG/250ML-% IV SOLN
INTRAVENOUS | Status: DC | PRN
  Administered 2023-10-13: 80 ug via INTRAVENOUS
  Administered 2023-10-13: 40 ug/min via INTRAVENOUS
  Administered 2023-10-13: 80 ug via INTRAVENOUS

## 2023-10-13 MED ORDER — ONDANSETRON HCL 4 MG/2ML IJ SOLN: 4.0000 mg | Freq: Once | INTRAMUSCULAR | Status: DC | PRN

## 2023-10-13 MED ORDER — INSULIN ASPART 100 UNIT/ML IJ SOLN
0.0000 [IU] | INTRAMUSCULAR | Status: DC | PRN
  Administered 2023-10-13: 3 [IU] via SUBCUTANEOUS
  Filled 2023-10-13: qty 1

## 2023-10-13 MED ORDER — TAMSULOSIN HCL 0.4 MG PO CAPS: 0.4000 mg | ORAL_CAPSULE | Freq: Every day | ORAL | 0 refills | Status: AC

## 2023-10-13 MED ORDER — IOHEXOL 300 MG/ML  SOLN
INTRAMUSCULAR | Status: DC | PRN
  Administered 2023-10-13: 7 mL

## 2023-10-13 MED ORDER — PROPOFOL 10 MG/ML IV BOLUS
INTRAVENOUS | Status: DC | PRN
  Administered 2023-10-13: 150 mg via INTRAVENOUS

## 2023-10-13 MED ORDER — ROCURONIUM BROMIDE 10 MG/ML (PF) SYRINGE
PREFILLED_SYRINGE | INTRAVENOUS | Status: DC | PRN
Start: 2023-10-13 — End: 2023-10-13
  Administered 2023-10-13: 50 mg via INTRAVENOUS

## 2023-10-13 MED ORDER — EPHEDRINE SULFATE-NACL 50-0.9 MG/10ML-% IV SOSY
PREFILLED_SYRINGE | INTRAVENOUS | Status: DC | PRN
  Administered 2023-10-13: 5 mg via INTRAVENOUS

## 2023-10-13 MED ORDER — FENTANYL CITRATE (PF) 100 MCG/2ML IJ SOLN
INTRAMUSCULAR | Status: DC | PRN
  Administered 2023-10-13: 50 ug via INTRAVENOUS
  Administered 2023-10-13 (×2): 25 ug via INTRAVENOUS

## 2023-10-13 MED ORDER — LACTATED RINGERS IV SOLN
INTRAVENOUS | Status: DC
Start: 2023-10-13 — End: 2023-10-14

## 2023-10-13 SURGICAL SUPPLY — 23 items
BAG URO CATCHER STRL LF (MISCELLANEOUS) ×1 IMPLANT
BASKET ZERO TIP NITINOL 2.4FR (BASKET) IMPLANT
CATH FOLEY 2WAY SLVR 5CC 18FR (CATHETERS) IMPLANT
CATH URETL OPEN 5X70 (CATHETERS) ×1 IMPLANT
CLOTH BEACON ORANGE TIMEOUT ST (SAFETY) ×1 IMPLANT
EXTRACTOR STONE 1.7FRX115CM (UROLOGICAL SUPPLIES) IMPLANT
FIBER LASER MOSES 200 DFL (Laser) IMPLANT
FIBER LASER MOSES 365 DFL (Laser) IMPLANT
GLOVE SURG LX STRL 8.0 MICRO (GLOVE) ×1 IMPLANT
GOWN STRL REUS W/ TWL XL LVL3 (GOWN DISPOSABLE) ×1 IMPLANT
GUIDEWIRE ANG ZIPWIRE 038X150 (WIRE) IMPLANT
GUIDEWIRE STR DUAL SENSOR (WIRE) ×1 IMPLANT
KIT TURNOVER KIT A (KITS) IMPLANT
LASER FIB FLEXIVA PULSE ID 910 (Laser) IMPLANT
MANIFOLD NEPTUNE II (INSTRUMENTS) ×1 IMPLANT
PACK CYSTO (CUSTOM PROCEDURE TRAY) ×1 IMPLANT
SHEATH NAVIGATOR HD 12/14X28 (SHEATH) IMPLANT
SHEATH NAVIGATOR HD 12/14X36 (SHEATH) IMPLANT
STENT URET 6FRX26 CONTOUR (STENTS) IMPLANT
SYR TOOMEY IRRIG 70ML (MISCELLANEOUS) IMPLANT
SYRINGE TOOMEY IRRIG 70ML (MISCELLANEOUS) IMPLANT
TUBING CONNECTING 10 (TUBING) ×1 IMPLANT
TUBING UROLOGY SET (TUBING) ×1 IMPLANT

## 2023-10-13 NOTE — Anesthesia Preprocedure Evaluation (Addendum)
 Anesthesia Evaluation  Patient identified by MRN, date of birth, ID band Patient awake    Reviewed: Allergy & Precautions, NPO status , Patient's Chart, lab work & pertinent test results  History of Anesthesia Complications (+) DIFFICULT AIRWAY, POST - OP SPINAL HEADACHE and history of anesthetic complications  Airway Mallampati: II  TM Distance: >3 FB Neck ROM: Full    Dental no notable dental hx. (+) Upper Dentures, Dental Advisory Given   Pulmonary    Pulmonary exam normal breath sounds clear to auscultation       Cardiovascular hypertension, Pt. 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    Neuro/Psych CVA, Residual Symptoms    GI/Hepatic negative GI ROS,,,  Endo/Other  diabetes, Poorly Controlled, Type 2  Class 3 obesity  Renal/GU Renal Insufficiency and ARFRenal disease     Musculoskeletal  (+) Arthritis ,    Abdominal   Peds  Hematology   Anesthesia Other Findings   Reproductive/Obstetrics                             Anesthesia Physical Anesthesia Plan  ASA: 3 and emergent  Anesthesia Plan: General   Post-op Pain Management: Tylenol PO (pre-op)*   Induction: Intravenous  PONV Risk Score and Plan: 3 and Treatment may vary due to age or medical condition and Ondansetron  Airway Management Planned: Oral ETT and Video Laryngoscope Planned  Additional Equipment: None  Intra-op Plan:   Post-operative Plan: Extubation in OR  Informed Consent: I have reviewed the patients History and Physical, chart, labs and discussed the procedure including the risks, benefits and alternatives for the proposed anesthesia with the patient or authorized representative who has indicated his/her understanding and acceptance.     Dental advisory given  Plan Discussed with: Surgeon and  CRNA  Anesthesia Plan Comments:         Anesthesia Quick Evaluation

## 2023-10-13 NOTE — Anesthesia Postprocedure Evaluation (Signed)
 Anesthesia Post Note  Patient: Jay Floyd  Procedure(s) Performed: CYSTOSCOPY, WITH RETROGRADE PYELOGRAM AND URETERAL STENT INSERTION CYSTOSCOPY, WITH BLADDER CALCULUS LITHOLAPAXY     Patient location during evaluation: PACU Anesthesia Type: General Level of consciousness: awake and alert Pain management: pain level controlled Vital Signs Assessment: post-procedure vital signs reviewed and stable Respiratory status: spontaneous breathing, nonlabored ventilation, respiratory function stable and patient connected to nasal cannula oxygen Cardiovascular status: blood pressure returned to baseline and stable Postop Assessment: no apparent nausea or vomiting Anesthetic complications: no  No notable events documented.  Last Vitals:  Vitals:   10/13/23 1915 10/13/23 1930  BP: 102/84 105/66  Pulse: 94 90  Resp: (!) 21 (!) 21  Temp:  36.8 C  SpO2: 96% 94%    Last Pain:  Vitals:   10/13/23 1930  TempSrc:   PainSc: 0-No pain                 Trevor Iha

## 2023-10-13 NOTE — Discharge Instructions (Signed)
 DISCHARGE INSTRUCTIONS FOR Ureteroscopy and/or Ureteral Stent Placement  MEDICATIONS:  1.  Robaxin 2. Tamsulosin  3. Pyridium  4. Hold Eliquis tomorrow.   ACTIVITY:  1. No strenuous activity x 1week  2. No driving while on narcotic pain medications  3. Drink plenty of water  4. Continue to walk at home - it is normal to see blood in the urine while the stent is in place, so keep active, but don't over do it.  5. May return to work/school tomorrow or when you feel ready  6. You may experience some pain when urinating in the kidney on the side that was operated on while the stent is in place this is normal  WHAT IS NORMAL TO EXPERIENCE: It is normal to feel the urge to urinate while the stent is in place It is normal to have blood in your urine while the stent is in place  It sometimes can be normal to have pain in your kidney when you urinate   BATHING:  1. You can shower and we recommend daily showers   DIET: You may return to your normal diet immediately. Because of the raw surface of your bladder, alcohol, spicy foods, acid type foods and drinks with caffeine may cause irritation or frequency and should be used in moderation. To keep your urine flowing freely and to avoid constipation, drink plenty of fluids during the day ( 8-10 glasses ). Tip: Avoid cranberry juice because it is very acidic.  SIGNS/SYMPTOMS TO CALL:  Please call us if you have a fever greater than 101.5, uncontrolled nausea/vomiting, uncontrolled pain, dizziness, unable to urinate, bloody urine with clots greater than the size of a quarter, chest pain, shortness of breath, leg swelling, leg pain, redness around wound, drainage from wound, or any other concerns or questions.   You can reach [place] at [phone].   FOLLOW-UP:  1. You will be called to have your catheter removed next week. 2,. You will be called to set up ureteroscopy in the near future.    Foley Catheter Care A soft, flexible tube (Foley  catheter) may have been placed in your bladder to drain urine and fluid. Follow these instructions: Taking Care of the Catheter Keep the area where the catheter leaves your body clean.  Attach the catheter to the leg so there is no tension on the catheter.  Keep the drainage bag below the level of the bladder, but keep it OFF the floor.  Do not take long soaking baths. Your caregiver will give instructions about showering.  Wash your hands before touching ANYTHING related to the catheter or bag.  Using mild soap and warm water on a washcloth:  Clean the area closest to the catheter insertion site using a circular motion around the catheter.  Clean the catheter itself by wiping AWAY from the insertion site for several inches down the tube.  NEVER wipe upward as this could sweep bacteria up into the urethra (tube in your body that normally drains the bladder) and cause infection.  Place a small amount of sterile lubricant at the tip of the penis where the catheter is entering.  Taking Care of the Drainage Bags Two drainage bags may be taken home: a large overnight drainage bag, and a smaller leg bag which fits underneath clothing.  It is okay to wear the overnight bag at any time, but NEVER wear the smaller leg bag at night.  Keep the drainage bag well below the level of your bladder. This  prevents backflow of urine into the bladder and allows the urine to drain freely.  Anchor the tubing to your leg to prevent pulling or tension on the catheter. Use tape or a leg strap provided by the hospital.  Empty the drainage bag when it is 1/2 to 3/4 full. Wash your hands before and after touching the bag.  Periodically check the tubing for kinks to make sure there is no pressure on the tubing which could restrict the flow of urine.  Changing the Drainage Bags Cleanse both ends of the clean bag with alcohol before changing.  Pinch off the rubber catheter to avoid urine spillage during the disconnection.   Disconnect the dirty bag and connect the clean one.  Empty the dirty bag carefully to avoid a urine spill.  Attach the new bag to the leg with tape or a leg strap.  Cleaning the Drainage Bags Whenever a drainage bag is disconnected, it must be cleaned quickly so it is ready for the next use.  Wash the bag in warm, soapy water.  Rinse the bag thoroughly with warm water.  Soak the bag for 30 minutes in a solution of white vinegar and water (1 cup vinegar to 1 quart warm water).  Rinse with warm water.   IT Normal To See Some blood in the urine is normal, as long as you can see your fingers through the catheter tubing (not the bag) this is ok. As long as the urine is not the consistency of tomato paste.  It will be normal to feel the urge to urinate often this is a side effect of the catheter  Some discharge around the catheter where it exits the penis is normal  SEEK MEDICAL CARE IF:  You have chills or night sweats.  You are leaking around your catheter or have problems with your catheter. It is not uncommon to have sporadic leakage around your catheter as a result of bladder spasms. If the leakage stops, there is not much need for concern. If you are uncertain, call your caregiver.  You develop side effects that you think are coming from your medicines.  SEEK IMMEDIATE MEDICAL CARE IF:  You are suddenly unable to urinate. Check to see if there are any kinks in the drainage tubing that may cause this. If you cannot find any kinks, call your caregiver immediately. This is an emergency.  You develop shortness of breath or chest pains.  Bleeding persists or clots develop in your urine.  You have a fever.  You develop pain in your back or over your lower belly (abdomen).  You develop pain or swelling in your legs.  Any problems you are having get worse rather than better.  MAKE SURE YOU:  Understand these instructions.  Will watch your condition.  Will get help right away if you are not  doing well or get worse.

## 2023-10-13 NOTE — Anesthesia Procedure Notes (Signed)
 Procedure Name: Intubation Date/Time: 10/13/2023 5:07 PM  Performed by: Ponciano Ort, CRNAPre-anesthesia Checklist: Patient identified, Emergency Drugs available, Suction available and Patient being monitored Patient Re-evaluated:Patient Re-evaluated prior to induction Oxygen Delivery Method: Circle system utilized Preoxygenation: Pre-oxygenation with 100% oxygen Induction Type: IV induction Ventilation: Mask ventilation without difficulty Laryngoscope Size: Glidescope and 4 Grade View: Grade I Tube type: Oral Tube size: 7.5 mm Number of attempts: 1 Airway Equipment and Method: Stylet and Oral airway Placement Confirmation: ETT inserted through vocal cords under direct vision, positive ETCO2 and breath sounds checked- equal and bilateral Secured at: 23 cm Tube secured with: Tape Dental Injury: Teeth and Oropharynx as per pre-operative assessment

## 2023-10-13 NOTE — H&P (Signed)
 83 year old male last seen in 2019 for kidney stone status post ureteroscopy with laser lithotripsy on the right side has also had a history as well prior to that. He is able to pass the stone fragments has not been seen since that time. He also has penile lesions for which he uses clotrimazole occasionally. Patient having nausea, poor PO intake. No GH. Patient has pain at bilateral flanks. Pt had some broth and food at 5AM. CT shows left ureteral stone and bladder stones. Exam shows very buried penis.   PMH: Hx of CVA, Eliquis for atrial fibrillation, hx of DM2. No Pulm hx. Cardiologist Alonza Smoker in [place] with [place].       ALLERGIES: Shellfish    MEDICATIONS: Metformin Hcl 1,000 mg tablet  Simvastatin 10 mg tablet  Tamsulosin Hcl 0.4 mg capsule  Aspir 81  Clotrimazole-Betamethasone 1 %-0.05 % cream  Diclofenac Sodium 50 mg tablet, delayed release  Glucosamine  Losartan Potassium 100 mg tablet  Nystatin  Vitamin B12  Vitamin D3     GU PSH: Cysto Remove Stent FB Sim - 2019 Lysis Penil Circumic Lesion - 2019 Ureteroscopic laser litho, Right - 2019     NON-GU PSH: Cholecystectomy (laparoscopic) Hernia Repair Knee Arthroscopy         GU PMH: Renal calculus - 2019, - 2019 Ureteral calculus - 2019    NON-GU PMH: Diabetes Type 2 Inflammatory liver disease, unspecified    FAMILY HISTORY: Breast Cancer - Mother Colon Cancer - Father Diabetes - Father Prostate Cancer - Father stroke - Father    Notes: 3 sons  1 daughter   SOCIAL HISTORY: Marital Status: Married Preferred Language: English; Race: White Current Smoking Status: Patient has never smoked.  <DIV'  Tobacco Use Assessment Completed:  Used Tobacco in last 30 days?   Has never drank.  Drinks 1 caffeinated drink per day. Patient's occupation is/was retired.     Notes: 3 sons 1 daughter   REVIEW OF SYSTEMS:     GU Review Male:  Patient denies frequent urination, hard to postpone urination, burning/  pain with urination, get up at night to urinate, leakage of urine, stream starts and stops, trouble starting your stream, have to strain to urinate , erection problems, and penile pain.    Gastrointestinal (Upper):  Patient denies nausea, vomiting, and indigestion/ heartburn.    Gastrointestinal (Lower):  Patient denies diarrhea and constipation.    Constitutional:  Patient denies fever, night sweats, weight loss, and fatigue.    Skin:  Patient denies skin rash/ lesion and itching.    Eyes:  Patient denies blurred vision and double vision.    Ears/ Nose/ Throat:  Patient denies sore throat and sinus problems.    Hematologic/Lymphatic:  Patient denies easy bruising and swollen glands.    Cardiovascular:  Patient denies leg swelling and chest pains.    Respiratory:  Patient denies cough and shortness of breath.    Endocrine:  Patient denies excessive thirst.    Musculoskeletal:  Patient denies back pain and joint pain.    Neurological:  Patient denies headaches and dizziness.    Psychologic:  Patient denies depression and anxiety.    VITAL SIGNS: None     GU PHYSICAL EXAMINATION:      Penis: BUried penis able to palpate the glans, meatus open      MULTI-SYSTEM PHYSICAL EXAMINATION:      Constitutional: Well-nourished. No physical deformities. Normally developed. Good grooming.     Respiratory: No labored breathing, no  use of accessory muscles.      Cardiovascular: Normal temperature, normal extremity pulses, no swelling, no varicosities.     Neurologic / Psychiatric: Oriented to time, oriented to place, oriented to person. No depression, no anxiety, no agitation.     Musculoskeletal: Normal gait and station of head and neck.            Complexity of Data:   Records Review:  Previous Patient Records  Urine Test Review:  Urinalysis  Urodynamics Review:  Review Bladder Scan  X-Ray Review: C.T. Abdomen/Pelvis: Reviewed Films. CT shows left renal pelvis stone at the U PJ. As well as multiple  bladder stones right nonobstructing stone there is mild to moderate left hydronephrosis.    PROCEDURES:    C.T. Urogram - [id number]      Patient confirmed No Neulasta OnPro Device.       PVR Ultrasound - 40981  Scanned Volume: 99 cc    Visit Complexity - G2211      Urinalysis w/Scope  Dipstick Dipstick Cont'd Micro  Color: Amber Bilirubin: Neg mg/dL WBC/hpf: >19/JYN  Appearance: Turbid Ketones: Trace mg/dL RBC/hpf: 3 - 82/NFA  Specific Gravity: 1.025 Blood: 3+ ery/uL Bacteria: Many (>50/hpf)  pH: 5.5 Protein: 2+ mg/dL Cystals: NS (Not Seen)  Glucose: Neg mg/dL Urobilinogen: 0.2 mg/dL Casts: NS (Not Seen)   Nitrites: Neg Trichomonas: Not Present   Leukocyte Esterase: 3+ leu/uL Mucous: Present    Epithelial Cells: 0 - 5/hpf    Yeast: NS (Not Seen)    Sperm: Not Present    ASSESSMENT:     ICD-10 Details  1 GU:  History of urolithiasis - Z87.442 Undiagnosed New Problem  2  Bladder Stone - N21.0 Undiagnosed New Problem   PLAN:   Orders  Labs Urine Culture  X-Rays: C.T. Stone Protocol Without I.V. Contrast  X-Ray Notes: History:   Hematuria: Yes / No   Patient to see MD after exam: Yes/ No   Previous exam:   When:   Where:   Diabetic: Yes / No   BUN/ Creatinine:   Date of last BUN Creatinine:   Weight in pounds:   Allergy- IV Contrast: Yes/ No  Prior Authorization #: [id number]   Schedule    Document  Letter(s):  Created for Patient: Clinical Summary   Notes:  Bladder stone and ureteral stone: Will plan to go to the OR to place left ureteral stent as well as cystolitholapaxy and plan for future ureteroscopy. Patient is agreeable to the plan will likely have to dilate the meatus to get into bladder.  Urine culture ordered today.   Due to tachycardia, poor PO intake will try to remove today.   We discussed the risk benefits and alternatives to ureteroscopy. This includes bleeding, infection, damage to surrounding structures including the urethra, bladder,  ureter, and kidney. With these possible injuries resulting in need for intervention in the future. We discussed inability to remove all the stone and requiring follow-up ureteroscopy. We also discussed the possibility of not being able to gain access to the kidney and the need for nephrostomy tube. Possibility of long-term stent was also discussed. The patient voiced their understanding and would like to proceed.

## 2023-10-13 NOTE — Transfer of Care (Signed)
 Immediate Anesthesia Transfer of Care Note  Patient: Jay Floyd  Procedure(s) Performed: CYSTOSCOPY, WITH RETROGRADE PYELOGRAM AND URETERAL STENT INSERTION CYSTOSCOPY, WITH BLADDER CALCULUS LITHOLAPAXY  Patient Location: PACU  Anesthesia Type:General  Level of Consciousness: drowsy  Airway & Oxygen Therapy: Patient Spontanous Breathing and Patient connected to face mask oxygen  Post-op Assessment: Report given to RN and Post -op Vital signs reviewed and stable  Post vital signs: Reviewed and stable  Last Vitals:  Vitals Value Taken Time  BP 117/75 10/13/23 1837  Temp    Pulse 91 10/13/23 1843  Resp 19 10/13/23 1843  SpO2 99 % 10/13/23 1843  Vitals shown include unfiled device data.  Last Pain:  Vitals:   10/13/23 1643  TempSrc:   PainSc: 0-No pain         Complications: No notable events documented.

## 2023-10-13 NOTE — Op Note (Addendum)
 Preoperative diagnosis: left obstructing ureteral stone Postoperative diagnosis: Same  Procedures performed: Cystoscopy, left retrograde pyelogram with interpretation, left ureteral stent placement Cystolithopaxy of stone greater than 3.5cm   Left retrograde pyelogram interpretation: Significant distended left renal pelvis for placement of Pollick catheter.  Surgeon: Dr. Vilma Prader  Findings: Left stent [phone] without strings placed bladder stone removed without complication No residual stones at end of the case Irritation at the posterior bladder at eh end of the case    Specimens: None  Indication: Jay Floyd is a 83 y.o. patient with left ureteral stone as well as large bladder stone he has an AKI of 2.16 and has been having significant symptoms will plan for left ureteral stent placement as well as cystolitholapaxy today.. After reviewing the management options for treatment, he elected to proceed with the above surgical procedure(s). We have discussed the potential benefits and risks of the procedure, side effects of the proposed treatment, the likelihood of the patient achieving the goals of the procedure, and any potential problems that might occur during the procedure or recuperation. Informed consent has been obtained.  Procedure in detail: Patient was consented prior to being brought back to the operating room. He was then brought back to the operating room placed on the table in supine position. General anesthesia was then induced and endotracheal tube inserted. This then placed in dorsolithotomy position and prepped and draped in the routine sterile fashion. A timeout was held.  Using a 22.5 [place] cystoscope with a 30  lens, I gently passed the scope into the patient's urethra and into the bladder under visual guidance. A 360 cystoscopic evaluation was performed with no mucosal abnormalities, no tumors, but there was significant amount of stone burden around 3.5 cm as  well as trabeculations.  I then passed a 0. 038 Sensor wire into the left ureteral orifice and into the left renal pelvis. I then slid a 5 [place] open-ended ureteral catheter over the wire and into the renal pelvis and removed the wire. Next I injected 10 cc of contrast into the renal collecting system and performed a retrograde pyelogram. I then replaced the wire through the open-ended ureteral catheter and remove the catheter. I then slid a 26 cm x 6 French double-J ureteral stent over the wire and into the renal pelvis under fluoroscopic guidance. Once a nice curl was noted in the renal pelvis I advanced the distal end of the stent into the bladder before removing the wire completely. A final fluoroscopic image was obtained confirming the curl in the renal pelvis as well as a curl in the bladder.  I then turned my attention to the bladder and use 910 m laser on settings of 1.5 J and 15 Hz I broke up the stone until it could all be evacuated with a [place] syringe.  Once all the stone was removed reinspection the bladder demonstrated some irritation and due to the fact that the laser positioning was not appropriate with the resectoscope and the bladder had to be distended and drained multiple times decision was made to place a catheter.  Reinspection of bladder demonstrated some irritation of the posterior bladder the left stent in place in the right ureteral orifice without any abnormalities.  An 64 French catheter with 10 cc balloon was placed catheter irrigated confirmed proper placement.  Disposition: The patient returned to the PACU in stable condition.

## 2023-10-14 ENCOUNTER — Encounter (HOSPITAL_COMMUNITY): Payer: Self-pay | Admitting: Urology

## 2023-10-18 DIAGNOSIS — N210 Calculus in bladder: Secondary | ICD-10-CM | POA: Diagnosis not present

## 2023-10-18 DIAGNOSIS — Z87442 Personal history of urinary calculi: Secondary | ICD-10-CM | POA: Diagnosis not present

## 2023-10-19 ENCOUNTER — Other Ambulatory Visit: Payer: Self-pay | Admitting: Urology

## 2023-10-19 NOTE — Progress Notes (Addendum)
 COVID Vaccine received:  []  No [x]  Yes Date of any COVID positive Test in last 90 days: no PCP - Fara Chute MD in [facility] Cardiologist -  Dr. Gwendlyn Deutscher at [facility] in [place] .  Chest x-ray -  EKG -  10/13/23 Epic Stress Test -  ECHO -  Cardiac Cath - 05/12/21 [facility]  Bowel Prep - [x]  No  []   Yes ______  Pacemaker / ICD device [x]  No []  Yes   Spinal Cord Stimulator:[x]  No []  Yes       History of Sleep Apnea? [x]  No []  Yes   CPAP used?- [x]  No []  Yes    Does the patient monitor blood sugar?          []  No [x]  Yes  []  N/A  Patient has: []  NO Hx DM   []  Pre-DM                 []  DM1  [x]   DM2 Does patient have a [employer] or Dexacom? [x]  No []  Yes   Fasting Blood Sugar Ranges-  Checks Blood Sugar ___1__ times a week  GLP1 agonist / usual dose - no GLP1 instructions:  SGLT-2 inhibitors / usual dose - bo SGLT-2 instructions:   Blood Thinner / Instructions:Eliquis last dose 3/24 8PM Aspirin Instructions:  Comments:   Activity level: Patient is able to climb a flight of stairs without difficulty; [x]  No CP  [x]  No SOB,   Patient can / perform ADLs without assistance.   Anesthesia review: Afib, CAD, DM, HTN  Patient denies shortness of breath, fever, cough and chest pain at PAT appointment.  Patient verbalized understanding and agreement to the Pre-Surgical Instructions that were given to them at this PAT appointment. Patient was also educated of the need to review these PAT instructions again prior to his/her surgery.I reviewed the appropriate phone numbers to call if they have any and questions or concerns.

## 2023-10-19 NOTE — Patient Instructions (Signed)
 SURGICAL WAITING ROOM VISITATION  Patients having surgery or a procedure may have no more than 2 support people in the waiting area - these visitors may rotate.    Children under the age of 79 must have an adult with them who is not the patient.  Due to an increase in RSV and influenza rates and associated hospitalizations, children ages 11 and under may not visit patients in [facility] hospitals.  Visitors with respiratory illnesses are discouraged from visiting and should remain at home.  If the patient needs to stay at the hospital during part of their recovery, the visitor guidelines for inpatient rooms apply. Pre-op nurse will coordinate an appropriate time for 1 support person to accompany patient in pre-op.  This support person may not rotate.    Please refer to the [facility] website for the visitor guidelines for Inpatients (after your surgery is over and you are in a regular room).       Your procedure is scheduled on: 10/28/23   Report to [facility] Main Entrance    Report to admitting at 5:30 AM   Call this number if you have problems the morning of surgery [phone]   Do not eat food or drink any liquids:After Midnight.but may have sips of water to take meds                                                      Oral Hygiene is also important to reduce your risk of infection.                                    Remember - BRUSH YOUR TEETH THE MORNING OF SURGERY WITH YOUR REGULAR TOOTHPASTE  DENTURES WILL BE REMOVED PRIOR TO SURGERY PLEASE DO NOT APPLY "Poly grip" OR ADHESIVES!!!   Stop all vitamins and herbal supplements 7 days before surgery.   Take these medicines the morning of surgery with A SIP OF WATER: Atorvastatin, Metoprolol, tamsulosin  DO NOT TAKE ANY ORAL DIABETIC MEDICATIONS DAY OF YOUR SURGERY HOLD Metformin the morning of surgery.             You may not have any metal on your body including hair pins, jewelry, and body piercing              Do not wear make-up, lotions, powders, perfumes/cologne, or deodorant              Men may shave face and neck.   Do not bring valuables to the hospital.  IS NOT             RESPONSIBLE   FOR VALUABLES.   Contacts, glasses, dentures or bridgework may not be worn into surgery.  DO NOT BRING YOUR HOME MEDICATIONS TO THE HOSPITAL. PHARMACY WILL DISPENSE MEDICATIONS LISTED ON YOUR MEDICATION LIST TO YOU DURING YOUR ADMISSION IN THE HOSPITAL!    Patients discharged on the day of surgery will not be allowed to drive home.  Someone NEEDS to stay with you for the first 24 hours after anesthesia.   Special Instructions: Bring a copy of your healthcare power of attorney and living will documents the day of surgery if you haven't scanned them before.  Please read over the following fact sheets you were given: IF YOU HAVE QUESTIONS ABOUT YOUR PRE-OP INSTRUCTIONS PLEASE CALL [phone] [place] [place]   If you received a COVID test during your pre-op visit  it is requested that you wear a mask when out in public, stay away from anyone that may not be feeling well and notify your surgeon if you develop symptoms. If you test positive for Covid or have been in contact with anyone that has tested positive in the last 10 days please notify you surgeon.    [facility] - Preparing for Surgery Before surgery, you can play an important role.  Because skin is not sterile, your skin needs to be as free of germs as possible.  You can reduce the number of germs on your skin by washing with CHG (chlorahexidine gluconate) soap before surgery.  CHG is an antiseptic cleaner which kills germs and bonds with the skin to continue killing germs even after washing. Please DO NOT use if you have an allergy to CHG or antibacterial soaps.  If your skin becomes reddened/irritated stop using the CHG and inform your nurse when you arrive at Short Stay. Do not shave (including legs and underarms) for at least 48  hours prior to the first CHG shower.  You may shave your face/neck.  Please follow these instructions carefully:  1.  Shower with CHG Soap the night before surgery and the  morning of surgery.  2.  If you choose to wash your hair, wash your hair first as usual with your normal  shampoo.  3.  After you shampoo, rinse your hair and body thoroughly to remove the shampoo.                             4.  Use CHG as you would any other liquid soap.  You can apply chg directly to the skin and wash.  Gently with a scrungie or clean washcloth.  5.  Apply the CHG Soap to your body ONLY FROM THE NECK DOWN.   Do   not use on face/ open                           Wound or open sores. Avoid contact with eyes, ears mouth and   genitals (private parts).                       Wash face,  Genitals (private parts) with your normal soap.             6.  Wash thoroughly, paying special attention to the area where your    surgery  will be performed.  7.  Thoroughly rinse your body with warm water from the neck down.  8.  DO NOT shower/wash with your normal soap after using and rinsing off the CHG Soap.                9.  Pat yourself dry with a clean towel.            10.  Wear clean pajamas.            11.  Place clean sheets on your bed the night of your first shower and do not  sleep with pets. Day of Surgery : Do not apply any lotions/deodorants the morning of surgery.  Please wear clean clothes to the hospital/surgery center.  FAILURE TO FOLLOW THESE INSTRUCTIONS MAY RESULT IN THE CANCELLATION OF YOUR SURGERY  PATIENT SIGNATURE_________________________________  NURSE SIGNATURE__________________________________  ________________________________________________________________________

## 2023-10-26 NOTE — H&P (Signed)
 83 year old male last seen in 2019 for kidney stone status post ureteroscopy with laser lithotripsy on the right side has also had a history as well prior to that. He is able to pass the stone fragments has not been seen since that time. He also has penile lesions for which he uses clotrimazole occasionally. Patient having nausea, poor PO intake. No GH. Patient has pain at bilateral flanks. Pt had some broth and food at 5AM. CT shows left ureteral stone and bladder stones. Exam shows very buried penis.   PMH: Hx of CVA, Eliquis for atrial fibrillation, hx of DM2. No Pulm hx. Cardiologist Alonza Smoker in [place] with [place].   Bladder stone:  10/18/23: s/p cystolithopaxy here fro [place] toay, currently on Keflex, catheter was removed he urinated small amounts and came back after attempting to void and bladder scan was 0.    Nephrolithiasis:  10/18/23: L ureteral stone plan for L URS w/ LL  10/26/23: plan for L URS today     ALLERGIES: Shellfish    MEDICATIONS: Cephalexin 500 mg capsule 1 capsule PO BID  Metformin Hcl 1,000 mg tablet  Simvastatin 10 mg tablet  Tamsulosin Hcl 0.4 mg capsule  Aspir 81  Clotrimazole-Betamethasone 1 %-0.05 % cream  Diclofenac Sodium 50 mg tablet, delayed release  Glucosamine  Losartan Potassium 100 mg tablet  Nystatin  Vitamin B12  Vitamin D3     GU PSH: Cysto Remove Stent FB Sim - 2019 Lysis Penil Circumic Lesion - 2019 Ureteroscopic laser litho, Right - 2019     NON-GU PSH: Cholecystectomy (laparoscopic) Hernia Repair Knee Arthroscopy Visit Complexity (formerly GPC1X) - 10/13/2023     GU PMH: Bladder Stone - 10/13/2023 History of urolithiasis - 10/13/2023 Renal calculus - 2019, - 2019 Ureteral calculus - 2019    NON-GU PMH: Diabetes Type 2 Inflammatory liver disease, unspecified    FAMILY HISTORY: Breast Cancer - Mother Colon Cancer - Father Diabetes - Father Prostate Cancer - Father stroke - Father    Notes: 3 sons  1 daughter    SOCIAL HISTORY: Marital Status: Married Preferred Language: English; Race: White Current Smoking Status: Patient has never smoked.   Tobacco Use Assessment Completed: Used Tobacco in last 30 days? Has never drank.  Drinks 1 caffeinated drink per day. Patient's occupation is/was retired.     Notes: 3 sons 1 daughter   REVIEW OF SYSTEMS:    GU Review Male:   Patient denies frequent urination, hard to postpone urination, burning/ pain with urination, get up at night to urinate, leakage of urine, stream starts and stops, trouble starting your stream, have to strain to urinate , erection problems, and penile pain.  Gastrointestinal (Upper):   Patient denies nausea, vomiting, and indigestion/ heartburn.  Gastrointestinal (Lower):   Patient denies diarrhea and constipation.  Constitutional:   Patient denies fever, night sweats, weight loss, and fatigue.  Skin:   Patient denies skin rash/ lesion and itching.  Eyes:   Patient denies blurred vision and double vision.  Ears/ Nose/ Throat:   Patient denies sore throat and sinus problems.  Hematologic/Lymphatic:   Patient denies swollen glands and easy bruising.  Cardiovascular:   Patient denies leg swelling and chest pains.  Respiratory:   Patient denies cough and shortness of breath.  Endocrine:   Patient denies excessive thirst.  Musculoskeletal:   Patient denies back pain and joint pain.  Neurological:   Patient denies headaches and dizziness.  Psychologic:   Patient denies depression and anxiety.  VITAL SIGNS: None   Complexity of Data:  Source Of History:  Patient  Records Review:   Previous Patient Records  Urodynamics Review:   Review Bladder Scan   PROCEDURES:         PVR Ultrasound - 54098        Voiding Trial - 51700  Instilled Volume: 17 cc         Visit Complexity - G2211    ASSESSMENT:      ICD-10 Details  1 GU:   Bladder Stone - N21.0 Acute, Stable  2   History of urolithiasis - Z87.442 Chronic, Stable      PLAN:           Document Letter(s):  Created for Patient: Clinical Summary         Notes:   Post cystolitholapaxy: Patient performed void trial today was able to urinate in small amounts he came back after several hours of walking around PVR was 0. Unconfirmed the patient's truly emptying his bladder gave patient the option of replacing catheter or going home understanding that if he is unable to urinate and started to have significant pain we will need to get the ER for catheter replacement patient would prefer to go home and will call back to pass to have catheter placed.   Left ureteral stone: Plan for URS today.   We discussed risk benefits alternatives to ureteroscopy.  This included bleeding infection and damage to surrounding structures surrounding structures including ureter as well as urethra.  We discussed the need for stent postoperatively as well as the potential symptoms of stent placement.  We discussed possible inability to complete procedure due to caliber of your ureter or inability to pass stone possibly requiring long-term stent versus nephrostomy tube.  We discussed need for possible second surgery.  Patient voiced their understanding and consent was obtained.

## 2023-10-27 ENCOUNTER — Encounter (HOSPITAL_COMMUNITY)
Admission: RE | Admit: 2023-10-27 | Discharge: 2023-10-27 | Disposition: A | Discharge status: Home / Self Care | Source: Ambulatory Visit | Attending: Urology | Admitting: Urology

## 2023-10-27 ENCOUNTER — Other Ambulatory Visit: Payer: Self-pay

## 2023-10-27 ENCOUNTER — Encounter (HOSPITAL_COMMUNITY): Payer: Self-pay

## 2023-10-27 VITALS — BP 148/90 | HR 90 | Resp 20 | Ht 71.5 in | Wt 298.0 lb

## 2023-10-27 DIAGNOSIS — I3481 Nonrheumatic mitral (valve) annulus calcification: Secondary | ICD-10-CM | POA: Insufficient documentation

## 2023-10-27 DIAGNOSIS — I4891 Unspecified atrial fibrillation: Secondary | ICD-10-CM | POA: Insufficient documentation

## 2023-10-27 DIAGNOSIS — Z6841 Body Mass Index (BMI) 40.0 and over, adult: Secondary | ICD-10-CM | POA: Insufficient documentation

## 2023-10-27 DIAGNOSIS — Z01812 Encounter for preprocedural laboratory examination: Secondary | ICD-10-CM | POA: Diagnosis not present

## 2023-10-27 DIAGNOSIS — I129 Hypertensive chronic kidney disease with stage 1 through stage 4 chronic kidney disease, or unspecified chronic kidney disease: Secondary | ICD-10-CM | POA: Diagnosis not present

## 2023-10-27 DIAGNOSIS — I428 Other cardiomyopathies: Secondary | ICD-10-CM | POA: Insufficient documentation

## 2023-10-27 DIAGNOSIS — E119 Type 2 diabetes mellitus without complications: Secondary | ICD-10-CM

## 2023-10-27 DIAGNOSIS — I2510 Atherosclerotic heart disease of native coronary artery without angina pectoris: Secondary | ICD-10-CM | POA: Diagnosis not present

## 2023-10-27 DIAGNOSIS — N201 Calculus of ureter: Secondary | ICD-10-CM | POA: Diagnosis not present

## 2023-10-27 DIAGNOSIS — Z7984 Long term (current) use of oral hypoglycemic drugs: Secondary | ICD-10-CM | POA: Diagnosis not present

## 2023-10-27 DIAGNOSIS — E669 Obesity, unspecified: Secondary | ICD-10-CM | POA: Insufficient documentation

## 2023-10-27 DIAGNOSIS — E1122 Type 2 diabetes mellitus with diabetic chronic kidney disease: Secondary | ICD-10-CM | POA: Insufficient documentation

## 2023-10-27 DIAGNOSIS — N189 Chronic kidney disease, unspecified: Secondary | ICD-10-CM | POA: Diagnosis not present

## 2023-10-27 DIAGNOSIS — Z7901 Long term (current) use of anticoagulants: Secondary | ICD-10-CM | POA: Diagnosis not present

## 2023-10-27 DIAGNOSIS — Z8673 Personal history of transient ischemic attack (TIA), and cerebral infarction without residual deficits: Secondary | ICD-10-CM | POA: Diagnosis not present

## 2023-10-27 HISTORY — DX: Cerebral infarction, unspecified: I63.9

## 2023-10-27 LAB — GLUCOSE, CAPILLARY: Glucose-Capillary: 122 mg/dL — ABNORMAL HIGH (ref 70–99)

## 2023-10-27 LAB — HEMOGLOBIN A1C
Hgb A1c MFr Bld: 7.3 % — ABNORMAL HIGH (ref 4.8–5.6)
Mean Plasma Glucose: 162.81 mg/dL

## 2023-10-27 NOTE — Progress Notes (Signed)
 Stop bang score of 5

## 2023-10-27 NOTE — Progress Notes (Signed)
 Case: [id number] Date/Time: 10/28/23 0730   Procedure: CYSTOSCOPY/URETEROSCOPY/HOLMIUM LASER/STENT PLACEMENT (Left) - WITH RETROGRADE PYELOGRAM   Anesthesia type: General   Diagnosis: Ureteral calculus [N20.1]   Pre-op diagnosis: LEFT URETERAL STONE   Location: [facility]   Surgeons: Adonis Brook, MD       DISCUSSION: Jay Floyd is an 83 yo male who presents to PAT prior to surgery above. 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  Patient underwent cystoscopy, cystolitholapaxy, and stent placement on 3/12. No complications noted. Glidescope used  Prior anesthesia complications include difficult intubation and spinal HA.   Patient follows with Cardiology at [facility] for hx of A fib, nonobstructive CAD, and NICM. Last seen in 11/2021 and has been lost to follow up. Patient has longstanding hx of A.fib and takes Eliquis and Metoprolol. Perfusion study in 04/2021 was low risk and last Echo was in 01/2021 which showed EF 35-40%.   Patient follows with PCP. Last seen on 08/25/23. Unable to view notes in epic. Records requested.  VS: BP (!) 148/90   Pulse 90   Resp 20   Ht 5' 11.5" (1.816 m)   Wt 135.2 kg   SpO2 96%   BMI 40.98 kg/m   PROVIDERS: Estanislado Pandy, MD in [facility] Cardiologist - Dr. Gwendlyn Deutscher at [place] in   LABS: Labs reviewed: Acceptable for surgery. (all labs ordered are listed, but only abnormal results are displayed)  Labs Reviewed  GLUCOSE, CAPILLARY - Abnormal; Notable for the following components:      Result Value   Glucose-Capillary 122 (*)    All other components within normal limits  HEMOGLOBIN A1C     IMAGES:   EKG 10/13/23:  Atrial fibrillation with premature ventricular or aberrantly conducted complexes, rate 95 Left axis deviation Inferior infarct , age undetermined Anterolateral infarct , age  undetermined  CV:  NM Gated cardiolite procedure 04/08/2021:  Impression:   Abnormal study  No evidence of inducible ischemia.  Fixed inferior defect suggests prior infarction  Reduced left ventricular function.   Echo 02/09/2021:  Summary   1. Overall left ventricular ejection fraction is estimated at 35 to 40%.   2. Moderately decreased global left ventricular systolic function.   3. Left ventricular wall motion abnormalities exist. See wall motion findings.   4. Moderately increased left ventricular internal cavity size.   5. Mildly reduced RV systolic function.   6. Moderately dilated left atrium.   7. Mild to moderate mitral annular calcification.   Past Medical History:  Diagnosis Date   Arthritis    Atrial fibrillation (HCC)    Carcinoma (HCC)    skin   Coronary artery disease    Diabetes mellitus without complication (HCC)    TYPE 2   Difficult intubation    HX OF 10-15 YEARS AGO HAD SURGERY SINCE 2013 after a kidney stent pt. woke up with a broken collar bone and rib and it is inconclusive to why.   Dyspnea    Dysrhythmia    Hepatitis    History of kidney stones    Hypercholesteremia    Hypertension    preventive   Renal disorder    Spinal headache    Stroke [facility])     Past Surgical History:  Procedure Laterality Date   ACHILLES TENDON REPAIR     ACHILLES TENDON REPAIR  bil   CHOLECYSTECTOMY     CIRCUMCISION     CLAVICLE SURGERY     cystocopy     12-13-17 Dr. Alvester Morin  ureteral stone   CYSTOSCOPY W/ URETERAL STENT PLACEMENT N/A 10/13/2023   Procedure: CYSTOSCOPY, WITH RETROGRADE PYELOGRAM AND URETERAL STENT INSERTION;  Surgeon: Adonis Brook, MD;  Location: [facility];  Service: Urology;  Laterality: N/A;   CYSTOSCOPY WITH LITHOLAPAXY N/A 10/13/2023   Procedure: CYSTOSCOPY, WITH BLADDER CALCULUS LITHOLAPAXY;  Surgeon: Adonis Brook, MD;  Location: [facility];  Service: Urology;  Laterality: N/A;   CYSTOSCOPY WITH RETROGRADE PYELOGRAM, URETEROSCOPY AND  STENT PLACEMENT Right 12/13/2017   Procedure: CYSTOSCOPY WITH RIGHT  RETROGRADE PYELOGRAM, URETEROSCOPY AND STENT PLACEMENT;  Surgeon: Crista Elliot, MD;  Location[facility];  Service: Urology;  Laterality: Right;   HERNIA REPAIR     ingunial   HOLMIUM LASER APPLICATION Right 12/13/2017   Procedure: HOLMIUM LASER APPLICATION;  Surgeon: Crista Elliot, MD;  Location[facility];  Service: Urology;  Laterality: Right;   left hand surgery     table saw accident 2017   LITHOTRIPSY      MEDICATIONS:  ammonium lactate (AMLACTIN) 12 % cream   amoxicillin (AMOXIL) 500 MG capsule   atorvastatin (LIPITOR) 40 MG tablet   cholecalciferol (VITAMIN D3) 25 MCG (1000 UNIT) tablet   ELIQUIS 5 MG TABS tablet   glucosamine-chondroitin 500-400 MG tablet   losartan (COZAAR) 100 MG tablet   metFORMIN (GLUCOPHAGE) 1000 MG tablet   metoprolol succinate (TOPROL-XL) 25 MG 24 hr tablet   NON FORMULARY   nystatin-triamcinolone (MYCOLOG II) cream   ondansetron (ZOFRAN) 4 MG tablet   oxyCODONE-acetaminophen (PERCOCET) 5-325 MG tablet   tamsulosin (FLOMAX) 0.4 MG CAPS capsule   tamsulosin (FLOMAX) 0.4 MG CAPS capsule   No current facility-administered medications for this encounter.   Marcille Blanco [facility] Short Stay/Anesthesiology [facility] Phone [phone] 10/27/2023 11:36 AM

## 2023-10-27 NOTE — Anesthesia Preprocedure Evaluation (Addendum)
 Anesthesia Evaluation  Patient identified by MRN, date of birth, ID band Patient awake    Reviewed: Allergy & Precautions, H&P , NPO status , Patient's Chart, lab work & pertinent test results  History of Anesthesia Complications (+) DIFFICULT AIRWAY and history of anesthetic complications  Airway Mallampati: II  TM Distance: >3 FB Neck ROM: Full    Dental no notable dental hx.    Pulmonary neg pulmonary ROS   Pulmonary exam normal breath sounds clear to auscultation       Cardiovascular hypertension, + CAD and +CHF  + dysrhythmias Atrial Fibrillation  Rhythm:Regular Rate:Normal  EF 35% from TTE 2022   Neuro/Psych  Headaches CVA  negative psych ROS   GI/Hepatic negative GI ROS,,,(+) Hepatitis -  Endo/Other  diabetes    Renal/GU Renal diseaseUreteral calculus  negative genitourinary   Musculoskeletal  (+) Arthritis ,    Abdominal   Peds negative pediatric ROS (+)  Hematology negative hematology ROS (+)   Anesthesia Other Findings Hx of difficult intubation in past. Most recent anesthetic 10/13/23 note with easy mask and easy intubation with glidescope.   Reproductive/Obstetrics negative OB ROS                              Anesthesia Physical Anesthesia Plan  ASA: 3  Anesthesia Plan: General   Post-op Pain Management:    Induction: Intravenous  PONV Risk Score and Plan: 2 and Ondansetron and Dexamethasone  Airway Management Planned: Oral ETT  Additional Equipment: None  Intra-op Plan:   Post-operative Plan: Extubation in OR  Informed Consent: I have reviewed the patients History and Physical, chart, labs and discussed the procedure including the risks, benefits and alternatives for the proposed anesthesia with the patient or authorized representative who has indicated his/her understanding and acceptance.     Dental advisory given  Plan Discussed with: CRNA  Anesthesia  Plan Comments: (See PAT note from 3/26   Jay Floyd is an 83 yo male who presents to PAT prior to surgery above. 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   Patient underwent cystoscopy, cystolitholapaxy, and stent placement on 3/12. No complications noted. Glidescope used   Prior anesthesia complications include difficult intubation and spinal HA.    Patient follows with Cardiology at [facility] for hx of A fib, nonobstructive CAD, and NICM. Last seen in 11/2021 and has been lost to follow up. Patient has longstanding hx of A.fib and takes Eliquis and Metoprolol. Perfusion study in 04/2021 was low risk and last Echo was in 01/2021 which showed EF 35-40%.    Patient follows with PCP. Last seen on 08/25/23. Unable to view notes in epic. Records requested. )         Anesthesia Quick Evaluation

## 2023-10-28 ENCOUNTER — Ambulatory Visit (HOSPITAL_BASED_OUTPATIENT_CLINIC_OR_DEPARTMENT_OTHER): Payer: Self-pay

## 2023-10-28 ENCOUNTER — Encounter (HOSPITAL_COMMUNITY): Payer: Self-pay | Admitting: Urology

## 2023-10-28 ENCOUNTER — Encounter (HOSPITAL_COMMUNITY)
Admission: RE | Disposition: A | Discharge status: Home / Self Care | Payer: Self-pay | Source: Home / Self Care | Attending: Urology

## 2023-10-28 ENCOUNTER — Ambulatory Visit (HOSPITAL_COMMUNITY): Payer: Self-pay | Admitting: Medical

## 2023-10-28 ENCOUNTER — Ambulatory Visit (HOSPITAL_COMMUNITY)
Admission: RE | Admit: 2023-10-28 | Discharge: 2023-10-28 | Disposition: A | Discharge status: Home / Self Care | Attending: Urology | Admitting: Urology

## 2023-10-28 ENCOUNTER — Ambulatory Visit (HOSPITAL_COMMUNITY)

## 2023-10-28 DIAGNOSIS — I2510 Atherosclerotic heart disease of native coronary artery without angina pectoris: Secondary | ICD-10-CM | POA: Insufficient documentation

## 2023-10-28 DIAGNOSIS — Z7984 Long term (current) use of oral hypoglycemic drugs: Secondary | ICD-10-CM | POA: Diagnosis not present

## 2023-10-28 DIAGNOSIS — E119 Type 2 diabetes mellitus without complications: Secondary | ICD-10-CM | POA: Diagnosis not present

## 2023-10-28 DIAGNOSIS — Z8619 Personal history of other infectious and parasitic diseases: Secondary | ICD-10-CM | POA: Insufficient documentation

## 2023-10-28 DIAGNOSIS — N403 Nodular prostate with lower urinary tract symptoms: Secondary | ICD-10-CM | POA: Diagnosis not present

## 2023-10-28 DIAGNOSIS — I509 Heart failure, unspecified: Secondary | ICD-10-CM | POA: Diagnosis not present

## 2023-10-28 DIAGNOSIS — N201 Calculus of ureter: Secondary | ICD-10-CM | POA: Diagnosis not present

## 2023-10-28 DIAGNOSIS — I110 Hypertensive heart disease with heart failure: Secondary | ICD-10-CM | POA: Diagnosis not present

## 2023-10-28 DIAGNOSIS — I4891 Unspecified atrial fibrillation: Secondary | ICD-10-CM | POA: Diagnosis not present

## 2023-10-28 DIAGNOSIS — N138 Other obstructive and reflux uropathy: Secondary | ICD-10-CM | POA: Insufficient documentation

## 2023-10-28 DIAGNOSIS — Z8673 Personal history of transient ischemic attack (TIA), and cerebral infarction without residual deficits: Secondary | ICD-10-CM | POA: Insufficient documentation

## 2023-10-28 HISTORY — PX: CYSTOSCOPY/URETEROSCOPY/HOLMIUM LASER/STENT PLACEMENT: SHX6546

## 2023-10-28 LAB — GLUCOSE, CAPILLARY
Glucose-Capillary: 120 mg/dL — ABNORMAL HIGH (ref 70–99)
Glucose-Capillary: 134 mg/dL — ABNORMAL HIGH (ref 70–99)

## 2023-10-28 SURGERY — CYSTOSCOPY/URETEROSCOPY/HOLMIUM LASER/STENT PLACEMENT
Anesthesia: General | Laterality: Left

## 2023-10-28 MED ORDER — SUGAMMADEX SODIUM 200 MG/2ML IV SOLN
INTRAVENOUS | Status: AC
  Filled 2023-10-28: qty 2

## 2023-10-28 MED ORDER — LIDOCAINE HCL (CARDIAC) PF 100 MG/5ML IV SOSY
PREFILLED_SYRINGE | INTRAVENOUS | Status: DC | PRN
  Administered 2023-10-28: 100 mg via INTRAVENOUS

## 2023-10-28 MED ORDER — PHENYLEPHRINE 80 MCG/ML (10ML) SYRINGE FOR IV PUSH (FOR BLOOD PRESSURE SUPPORT)
PREFILLED_SYRINGE | INTRAVENOUS | Status: AC
Start: 2023-10-28 — End: ?
  Filled 2023-10-28: qty 10

## 2023-10-28 MED ORDER — SUCCINYLCHOLINE CHLORIDE 200 MG/10ML IV SOSY
PREFILLED_SYRINGE | INTRAVENOUS | Status: DC | PRN
Start: 2023-10-28 — End: 2023-10-28
  Administered 2023-10-28: 140 mg via INTRAVENOUS

## 2023-10-28 MED ORDER — IOHEXOL 300 MG/ML  SOLN
INTRAMUSCULAR | Status: DC | PRN
  Administered 2023-10-28: 15 mL

## 2023-10-28 MED ORDER — LIDOCAINE HCL (PF) 2 % IJ SOLN
INTRAMUSCULAR | Status: AC
  Filled 2023-10-28: qty 5

## 2023-10-28 MED ORDER — CIPROFLOXACIN IN D5W 400 MG/200ML IV SOLN
INTRAVENOUS | Status: DC | PRN
Start: 2023-10-28 — End: 2023-10-28
  Administered 2023-10-28: 400 mg via INTRAVENOUS

## 2023-10-28 MED ORDER — METHOCARBAMOL 750 MG PO TABS: 750.0000 mg | ORAL_TABLET | Freq: Four times a day (QID) | ORAL | 0 refills | Status: AC

## 2023-10-28 MED ORDER — PHENYLEPHRINE 80 MCG/ML (10ML) SYRINGE FOR IV PUSH (FOR BLOOD PRESSURE SUPPORT)
PREFILLED_SYRINGE | INTRAVENOUS | Status: DC | PRN
  Administered 2023-10-28: 160 ug via INTRAVENOUS
  Administered 2023-10-28 (×2): 80 ug via INTRAVENOUS

## 2023-10-28 MED ORDER — LACTATED RINGERS IV SOLN: INTRAVENOUS | Status: DC

## 2023-10-28 MED ORDER — SODIUM CHLORIDE 0.9 % IR SOLN
Status: DC | PRN
  Administered 2023-10-28: 6000 mL

## 2023-10-28 MED ORDER — PHENYLEPHRINE 80 MCG/ML (10ML) SYRINGE FOR IV PUSH (FOR BLOOD PRESSURE SUPPORT)
PREFILLED_SYRINGE | INTRAVENOUS | Status: AC
  Filled 2023-10-28: qty 10

## 2023-10-28 MED ORDER — ACETAMINOPHEN 500 MG PO TABS
1000.0000 mg | ORAL_TABLET | Freq: Once | ORAL | Status: AC
  Administered 2023-10-28: 1000 mg via ORAL
  Filled 2023-10-28: qty 2

## 2023-10-28 MED ORDER — CHLORHEXIDINE GLUCONATE 0.12 % MT SOLN: 15.0000 mL | Freq: Once | OROMUCOSAL | Status: DC

## 2023-10-28 MED ORDER — OXYCODONE HCL 5 MG/5ML PO SOLN: 5.0000 mg | Freq: Once | ORAL | Status: DC | PRN

## 2023-10-28 MED ORDER — FENTANYL CITRATE (PF) 100 MCG/2ML IJ SOLN
INTRAMUSCULAR | Status: DC | PRN
  Administered 2023-10-28 (×2): 50 ug via INTRAVENOUS

## 2023-10-28 MED ORDER — SUCCINYLCHOLINE CHLORIDE 200 MG/10ML IV SOSY
PREFILLED_SYRINGE | INTRAVENOUS | Status: AC
Start: 2023-10-28 — End: ?
  Filled 2023-10-28: qty 10

## 2023-10-28 MED ORDER — ONDANSETRON HCL 4 MG/2ML IJ SOLN
INTRAMUSCULAR | Status: AC
  Filled 2023-10-28: qty 2

## 2023-10-28 MED ORDER — PHENYLEPHRINE HCL (PRESSORS) 10 MG/ML IV SOLN
INTRAVENOUS | Status: AC
  Filled 2023-10-28: qty 1

## 2023-10-28 MED ORDER — ONDANSETRON HCL 4 MG/2ML IJ SOLN
INTRAMUSCULAR | Status: DC | PRN
  Administered 2023-10-28: 4 mg via INTRAVENOUS

## 2023-10-28 MED ORDER — SUGAMMADEX SODIUM 200 MG/2ML IV SOLN
INTRAVENOUS | Status: DC | PRN
  Administered 2023-10-28: 250 mg via INTRAVENOUS

## 2023-10-28 MED ORDER — PROPOFOL 10 MG/ML IV BOLUS
INTRAVENOUS | Status: DC | PRN
  Administered 2023-10-28: 30 mg via INTRAVENOUS
  Administered 2023-10-28: 20 mg via INTRAVENOUS
  Administered 2023-10-28: 80 mg via INTRAVENOUS

## 2023-10-28 MED ORDER — INSULIN ASPART 100 UNIT/ML IJ SOLN: 0.0000 [IU] | INTRAMUSCULAR | Status: DC | PRN

## 2023-10-28 MED ORDER — HYOSCYAMINE SULFATE 0.125 MG PO TBDP: 0.1250 mg | ORAL_TABLET | Freq: Four times a day (QID) | ORAL | 0 refills | Status: AC | PRN

## 2023-10-28 MED ORDER — FENTANYL CITRATE (PF) 100 MCG/2ML IJ SOLN
INTRAMUSCULAR | Status: AC
  Filled 2023-10-28: qty 2

## 2023-10-28 MED ORDER — OXYCODONE HCL 5 MG PO TABS: 5.0000 mg | ORAL_TABLET | Freq: Once | ORAL | Status: DC | PRN

## 2023-10-28 MED ORDER — ACETAMINOPHEN 10 MG/ML IV SOLN: 1000.0000 mg | Freq: Once | INTRAVENOUS | Status: DC | PRN

## 2023-10-28 MED ORDER — ROCURONIUM BROMIDE 10 MG/ML (PF) SYRINGE
PREFILLED_SYRINGE | INTRAVENOUS | Status: AC
Start: 2023-10-28 — End: ?
  Filled 2023-10-28: qty 10

## 2023-10-28 MED ORDER — 0.9 % SODIUM CHLORIDE (POUR BTL) OPTIME
TOPICAL | Status: DC | PRN
  Administered 2023-10-28: 1000 mL

## 2023-10-28 MED ORDER — FENTANYL CITRATE PF 50 MCG/ML IJ SOSY: 25.0000 ug | PREFILLED_SYRINGE | INTRAMUSCULAR | Status: DC | PRN

## 2023-10-28 MED ORDER — ROCURONIUM BROMIDE 100 MG/10ML IV SOLN
INTRAVENOUS | Status: DC | PRN
  Administered 2023-10-28: 40 mg via INTRAVENOUS

## 2023-10-28 MED ORDER — PHENYLEPHRINE HCL-NACL 20-0.9 MG/250ML-% IV SOLN
INTRAVENOUS | Status: DC | PRN
  Administered 2023-10-28: 50 ug/min via INTRAVENOUS

## 2023-10-28 MED ORDER — PROPOFOL 10 MG/ML IV BOLUS
INTRAVENOUS | Status: AC
  Filled 2023-10-28: qty 20

## 2023-10-28 MED ORDER — STERILE WATER FOR IRRIGATION IR SOLN
Status: DC | PRN
  Administered 2023-10-28: 500 mL

## 2023-10-28 MED ORDER — DROPERIDOL 2.5 MG/ML IJ SOLN: 0.6250 mg | Freq: Once | INTRAMUSCULAR | Status: DC | PRN

## 2023-10-28 MED ORDER — CIPROFLOXACIN IN D5W 400 MG/200ML IV SOLN
400.0000 mg | INTRAVENOUS | Status: DC
  Filled 2023-10-28: qty 200

## 2023-10-28 MED ORDER — ORAL CARE MOUTH RINSE: 15.0000 mL | Freq: Once | OROMUCOSAL | Status: DC

## 2023-10-28 SURGICAL SUPPLY — 22 items
BAG URINE DRAIN 2000ML AR STRL (UROLOGICAL SUPPLIES) IMPLANT
BAG URO CATCHER STRL LF (MISCELLANEOUS) ×1 IMPLANT
BASKET ZERO TIP NITINOL 2.4FR (BASKET) IMPLANT
BULB IRRIG PATHFIND (MISCELLANEOUS) IMPLANT
CATH FOLEY 2WAY SLVR 5CC 18FR (CATHETERS) IMPLANT
CATH URETL OPEN 5X70 (CATHETERS) ×1 IMPLANT
CLOTH BEACON ORANGE TIMEOUT ST (SAFETY) ×1 IMPLANT
EXTRACTOR STONE 1.7FRX115CM (UROLOGICAL SUPPLIES) IMPLANT
FIBER LASER MOSES 200 DFL (Laser) IMPLANT
FIBER LASER MOSES 365 DFL (Laser) IMPLANT
GLOVE SURG LX STRL 8.0 MICRO (GLOVE) ×1 IMPLANT
GOWN STRL REUS W/ TWL XL LVL3 (GOWN DISPOSABLE) ×1 IMPLANT
GUIDEWIRE ANG ZIPWIRE 038X150 (WIRE) IMPLANT
GUIDEWIRE STR DUAL SENSOR (WIRE) ×1 IMPLANT
KIT TURNOVER KIT A (KITS) IMPLANT
MANIFOLD NEPTUNE II (INSTRUMENTS) ×1 IMPLANT
PACK CYSTO (CUSTOM PROCEDURE TRAY) ×1 IMPLANT
SHEATH NAVIGATOR HD 12/14X28 (SHEATH) IMPLANT
SHEATH NAVIGATOR HD 12/14X36 (SHEATH) IMPLANT
TRACTIP FLEXIVA PULSE ID 200 (Laser) IMPLANT
TUBING CONNECTING 10 (TUBING) ×1 IMPLANT
TUBING UROLOGY SET (TUBING) ×1 IMPLANT

## 2023-10-28 NOTE — Anesthesia Procedure Notes (Signed)
 Procedure Name: Intubation Date/Time: 10/28/2023 7:57 AM  Performed by: Micki Riley, CRNAPre-anesthesia Checklist: Patient identified, Emergency Drugs available, Suction available and Patient being monitored Patient Re-evaluated:Patient Re-evaluated prior to induction Oxygen Delivery Method: Circle System Utilized Preoxygenation: Pre-oxygenation with 100% oxygen Induction Type: IV induction Ventilation: Mask ventilation without difficulty Laryngoscope Size: Miller and 2 Grade View: Grade I Tube type: Oral Tube size: 7.5 mm Number of attempts: 1 Airway Equipment and Method: Stylet, Oral airway and Patient positioned with wedge pillow Placement Confirmation: ETT inserted through vocal cords under direct vision, positive ETCO2 and breath sounds checked- equal and bilateral Secured at: 22 cm Tube secured with: Tape Dental Injury: Teeth and Oropharynx as per pre-operative assessment

## 2023-10-28 NOTE — Anesthesia Postprocedure Evaluation (Signed)
 Anesthesia Post Note  Patient: Jay Floyd  Procedure(s) Performed: CYSTOSCOPY/URETEROSCOPY/HOLMIUM LASER/LEFT STENT REMOVAL/LEFT STENT PLACEMENT (Left)     Patient location during evaluation: PACU Anesthesia Type: General Level of consciousness: awake and alert Pain management: pain level controlled Vital Signs Assessment: post-procedure vital signs reviewed and stable Respiratory status: spontaneous breathing, nonlabored ventilation, respiratory function stable and patient connected to nasal cannula oxygen Cardiovascular status: blood pressure returned to baseline and stable Postop Assessment: no apparent nausea or vomiting Anesthetic complications: no   No notable events documented.  Last Vitals:  Vitals:   10/28/23 1050 10/28/23 1057  BP:  131/87  Pulse:  83  Resp:  16  Temp: 36.6 C 36.6 C  SpO2:  99%    Last Pain:  Vitals:   10/28/23 1057  TempSrc: Oral  PainSc: 0-No pain                 [place]

## 2023-10-28 NOTE — Transfer of Care (Signed)
 Immediate Anesthesia Transfer of Care Note  Patient: Jay Floyd  Procedure(s) Performed: CYSTOSCOPY/URETEROSCOPY/HOLMIUM LASER/LEFT STENT REMOVAL/LEFT STENT PLACEMENT (Left)  Patient Location: PACU  Anesthesia Type:General  Level of Consciousness: awake, alert , and oriented  Airway & Oxygen Therapy: Patient Spontanous Breathing and Patient connected to face mask oxygen  Post-op Assessment: Report given to RN and Post -op Vital signs reviewed and stable  Post vital signs: Reviewed and stable  Last Vitals:  Vitals Value Taken Time  BP 121/77 10/28/23 0949  Temp 97   Pulse 80 10/28/23 0951  Resp 18 10/28/23 0951  SpO2 99 % 10/28/23 0951  Vitals shown include unfiled device data.  Last Pain:  Vitals:   10/28/23 0712  TempSrc:   PainSc: 0-No pain      Patients Stated Pain Goal: 5 (10/28/23 0656)  Complications: No notable events documented.

## 2023-10-28 NOTE — Discharge Instructions (Signed)
 DISCHARGE INSTRUCTIONS FOR Ureteroscopy and/or Ureteral Stent Placement  MEDICATIONS:  1.  Robaxin 2. Tamsulosin  3. Hyoscyamine   ACTIVITY:  1. No strenuous activity x 1week  2. No driving while on narcotic pain medications  3. Drink plenty of water  4. Continue to walk at home - it is normal to see blood in the urine while the stent is in place, so keep active, but don't over do it.  5. May return to work/school tomorrow or when you feel ready  6. You may experience some pain when urinating in the kidney on the side that was operated on while the stent is in place this is normal  WHAT IS NORMAL TO EXPERIENCE: It is normal to feel the urge to urinate while the stent is in place It is normal to have blood in your urine while the stent is in place  It sometimes can be normal to have pain in your kidney when you urinate   BATHING:  1. You can shower and we recommend daily showers   DIET: You may return to your normal diet immediately. Because of the raw surface of your bladder, alcohol, spicy foods, acid type foods and drinks with caffeine may cause irritation or frequency and should be used in moderation. To keep your urine flowing freely and to avoid constipation, drink plenty of fluids during the day ( 8-10 glasses ). Tip: Avoid cranberry juice because it is very acidic.  SIGNS/SYMPTOMS TO CALL:  Please call us if you have a fever greater than 101.5, uncontrolled nausea/vomiting, uncontrolled pain, dizziness, unable to urinate, bloody urine with clots greater than the size of a quarter, chest pain, shortness of breath, leg swelling, leg pain, redness around wound, drainage from wound, or any other concerns or questions.   You can reach [place] at [phone].   FOLLOW-UP:  1. You you have been set up for f/u in 6-8 weeks  2. You will have your stent removed in clinic in 2 weeks you will be called to remove the catheter as well.

## 2023-10-28 NOTE — Op Note (Signed)
 Preoperative diagnosis: left ureteral calculus  Postoperative diagnosis: left ureteral calculus  Procedure:  Cystoscopy left ureteroscopy and stone removal (1.3 cm) Ureteroscopic laser lithotripsy left 40F x 26 ureteral stent placement  left retrograde pyelography with interpretation Retrieval of indwelling stent in the ureter  Surgeon: Dr. 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  Left retrograde pyelogram interpretation: Distended left renal pelvis no ureteral perforation normal draining of contrast.  EBL: Minimal  Specimens: left ureteral calculus  Disposition of specimens: [facility] for stone analysis  Indication: Jay Floyd is a 83 y.o.   patient with a bladder stone BPH and left ureteral stone and associated left sided symptoms patient was previously stented and had cystolitholapaxy for bladder stone completed. After reviewing the management options for treatment, the patient elected to proceed with the above surgical procedure(s). We have discussed the potential benefits and risks of the procedure, side effects of the proposed treatment, the likelihood of the patient achieving the goals of the procedure, and any potential problems that might occur during the procedure or recuperation. Informed consent has been obtained.   Description of procedure:  The patient was taken to the operating room and general anesthesia was induced.  The patient was placed in the dorsal lithotomy position, prepped and draped in the usual sterile fashion, and preoperative antibiotics were administered. A preoperative time-out was performed.   Cystourethroscopy was performed.  The patient's urethra was examined and was normal.   The prostate was noted to have bilateral hyperplasia with obstruction as well as an abnormal nodule poking into the lumen of the prostatic urethra near the Vero on the left side.  Pan cystoscopy was performed and the bladder systematically examined in its entirety. There was no evidence for any bladder tumors, stones, or other mucosal pathology.    Attention then turned to the left ureteral orifice and a ureteral catheter was used to intubate the ureteral orifice.    A 0.38 sensor guidewire was then advanced up the left ureter into the renal pelvis under fluoroscopic guidance.  During this process the left ureter stent was pushed into the left ureter.  The 6 Fr semirigid ureteroscope was then advanced into the ureter identified the stent and then pulled it out using a grasper.   Next the semirigid was exchanged for a 4.5 French semirigid and this was advanced up to the level of the stone.   Next to the guidewire and the calculus was identified.   The stone was then fragmented with the 200 micron holmium laser fiber on a setting of 0.6 and frequency of 8 Hz.   After the stone was broken up retrograde pyelogram was performed with findings as noted above.  All stones were then removed from the ureter with an N-gage nitinol basket.  Reinspection of the ureter revealed no remaining visible stones or fragments.   The wire was then backloaded through the cystoscope and a ureteral stent was advance over the wire using Seldinger technique.  The stent was positioned appropriately under fluoroscopic and cystoscopic guidance.  The wire was then removed with an adequate stent curl noted in the renal pelvis as well as in the bladder.  The bladder was then emptied and the procedure ended.  The patient appeared to tolerate the  procedure well and without complications.  The patient was able to be awakened and transferred to the recovery unit in satisfactory condition.   Disposition:The patient will be scheduled  for stent removal in 2 weeks in our clinic.

## 2023-10-29 ENCOUNTER — Encounter (HOSPITAL_COMMUNITY): Payer: Self-pay | Admitting: Urology

## 2023-11-01 DIAGNOSIS — E1122 Type 2 diabetes mellitus with diabetic chronic kidney disease: Secondary | ICD-10-CM | POA: Diagnosis not present

## 2023-11-01 DIAGNOSIS — I482 Chronic atrial fibrillation, unspecified: Secondary | ICD-10-CM | POA: Diagnosis not present

## 2023-11-01 DIAGNOSIS — E782 Mixed hyperlipidemia: Secondary | ICD-10-CM | POA: Diagnosis not present

## 2023-11-04 LAB — CALCULI, WITH PHOTOGRAPH (CLINICAL LAB)
Ammonium Acid Urate Calculi: 20 %
Calcium Oxalate Monohydrate: 70 %
Uric Acid Calculi: 10 %
Weight Calculi: 75 mg

## 2023-11-06 ENCOUNTER — Encounter (HOSPITAL_COMMUNITY): Payer: Self-pay | Admitting: Emergency Medicine

## 2023-11-06 ENCOUNTER — Other Ambulatory Visit: Payer: Self-pay

## 2023-11-06 ENCOUNTER — Emergency Department (HOSPITAL_COMMUNITY)

## 2023-11-06 ENCOUNTER — Emergency Department (HOSPITAL_COMMUNITY)
Admission: EM | Admit: 2023-11-06 | Discharge: 2023-11-06 | Disposition: A | Discharge status: Home / Self Care | Attending: Emergency Medicine | Admitting: Emergency Medicine

## 2023-11-06 DIAGNOSIS — N136 Pyonephrosis: Secondary | ICD-10-CM | POA: Diagnosis not present

## 2023-11-06 DIAGNOSIS — Z7984 Long term (current) use of oral hypoglycemic drugs: Secondary | ICD-10-CM | POA: Diagnosis not present

## 2023-11-06 DIAGNOSIS — I2510 Atherosclerotic heart disease of native coronary artery without angina pectoris: Secondary | ICD-10-CM | POA: Diagnosis not present

## 2023-11-06 DIAGNOSIS — R739 Hyperglycemia, unspecified: Secondary | ICD-10-CM | POA: Insufficient documentation

## 2023-11-06 DIAGNOSIS — Z79899 Other long term (current) drug therapy: Secondary | ICD-10-CM | POA: Diagnosis not present

## 2023-11-06 DIAGNOSIS — N390 Urinary tract infection, site not specified: Secondary | ICD-10-CM

## 2023-11-06 DIAGNOSIS — N200 Calculus of kidney: Secondary | ICD-10-CM | POA: Diagnosis not present

## 2023-11-06 DIAGNOSIS — R109 Unspecified abdominal pain: Secondary | ICD-10-CM | POA: Diagnosis present

## 2023-11-06 DIAGNOSIS — K5750 Diverticulosis of both small and large intestine without perforation or abscess without bleeding: Secondary | ICD-10-CM | POA: Diagnosis not present

## 2023-11-06 DIAGNOSIS — K8689 Other specified diseases of pancreas: Secondary | ICD-10-CM | POA: Diagnosis not present

## 2023-11-06 DIAGNOSIS — I10 Essential (primary) hypertension: Secondary | ICD-10-CM | POA: Insufficient documentation

## 2023-11-06 DIAGNOSIS — E119 Type 2 diabetes mellitus without complications: Secondary | ICD-10-CM | POA: Insufficient documentation

## 2023-11-06 DIAGNOSIS — Z7901 Long term (current) use of anticoagulants: Secondary | ICD-10-CM | POA: Diagnosis not present

## 2023-11-06 DIAGNOSIS — N132 Hydronephrosis with renal and ureteral calculous obstruction: Secondary | ICD-10-CM | POA: Diagnosis not present

## 2023-11-06 DIAGNOSIS — D649 Anemia, unspecified: Secondary | ICD-10-CM | POA: Insufficient documentation

## 2023-11-06 DIAGNOSIS — K429 Umbilical hernia without obstruction or gangrene: Secondary | ICD-10-CM | POA: Diagnosis not present

## 2023-11-06 LAB — URINALYSIS, ROUTINE W REFLEX MICROSCOPIC
Bilirubin Urine: NEGATIVE
Glucose, UA: NEGATIVE mg/dL
Ketones, ur: NEGATIVE mg/dL
Nitrite: NEGATIVE
Protein, ur: 100 mg/dL — AB
RBC / HPF: >50 RBC/hpf (ref 0–5)
Specific Gravity, Urine: 1.012 (ref 1.005–1.030)
WBC, UA: >50 WBC/hpf (ref 0–5)
pH: 5 (ref 5.0–8.0)

## 2023-11-06 LAB — BASIC METABOLIC PANEL WITH GFR
Anion gap: 8 (ref 5–15)
BUN: 14 mg/dL (ref 8–23)
CO2: 23 mmol/L (ref 22–32)
Calcium: 8.2 mg/dL — ABNORMAL LOW (ref 8.9–10.3)
Chloride: 108 mmol/L (ref 98–111)
Creatinine, Ser: 1.17 mg/dL (ref 0.61–1.24)
GFR, Estimated: >60 mL/min (ref >60)
Glucose, Bld: 177 mg/dL — ABNORMAL HIGH (ref 70–99)
Potassium: 3.7 mmol/L (ref 3.5–5.1)
Sodium: 139 mmol/L (ref 135–145)

## 2023-11-06 LAB — CBC WITH DIFFERENTIAL/PLATELET
Abs Immature Granulocytes: 0.01 10*3/uL (ref 0.00–0.07)
Basophils Absolute: 0 10*3/uL (ref 0.0–0.1)
Basophils Relative: 1 %
Eosinophils Absolute: 0.2 10*3/uL (ref 0.0–0.5)
Eosinophils Relative: 4 %
HCT: 37.3 % — ABNORMAL LOW (ref 39.0–52.0)
Hemoglobin: 11.6 g/dL — ABNORMAL LOW (ref 13.0–17.0)
Immature Granulocytes: 0 %
Lymphocytes Relative: 12 %
Lymphs Abs: 0.6 10*3/uL — ABNORMAL LOW (ref 0.7–4.0)
MCH: 29.1 pg (ref 26.0–34.0)
MCHC: 31.1 g/dL (ref 30.0–36.0)
MCV: 93.7 fL (ref 80.0–100.0)
Monocytes Absolute: 0.4 10*3/uL (ref 0.1–1.0)
Monocytes Relative: 8 %
Neutro Abs: 4.2 10*3/uL (ref 1.7–7.7)
Neutrophils Relative %: 75 %
Platelets: 149 10*3/uL — ABNORMAL LOW (ref 150–400)
RBC: 3.98 MIL/uL — ABNORMAL LOW (ref 4.22–5.81)
RDW: 14.8 % (ref 11.5–15.5)
WBC: 5.5 10*3/uL (ref 4.0–10.5)
nRBC: 0 % (ref 0.0–0.2)

## 2023-11-06 MED ORDER — SODIUM CHLORIDE 0.9 % IV SOLN
2.0000 g | Freq: Once | INTRAVENOUS | Status: AC
  Administered 2023-11-06: 2 g via INTRAVENOUS
  Filled 2023-11-06: qty 20

## 2023-11-06 MED ORDER — CEFUROXIME AXETIL 500 MG PO TABS: 500.0000 mg | ORAL_TABLET | Freq: Two times a day (BID) | ORAL | 0 refills | Status: AC

## 2023-11-06 MED ORDER — FENTANYL CITRATE PF 50 MCG/ML IJ SOSY: 50.0000 ug | PREFILLED_SYRINGE | INTRAMUSCULAR | Status: DC | PRN

## 2023-11-06 MED ORDER — ONDANSETRON HCL 4 MG/2ML IJ SOLN
4.0000 mg | Freq: Three times a day (TID) | INTRAMUSCULAR | Status: DC | PRN
  Filled 2023-11-06: qty 2

## 2023-11-06 MED ORDER — FENTANYL CITRATE PF 50 MCG/ML IJ SOSY
50.0000 ug | PREFILLED_SYRINGE | Freq: Once | INTRAMUSCULAR | Status: DC
  Filled 2023-11-06: qty 1

## 2023-11-06 NOTE — ED Provider Notes (Signed)
 [place] EMERGENCY DEPARTMENT AT [facility] Provider Note   CSN: [id number] Arrival date & time: 11/06/23  1452     History  Chief Complaint  Patient presents with   Flank Pain    Jay Floyd is a 83 y.o. male.   Flank Pain     83 year old male with medical history significant for CAD, DM2, HTN, HLD, CVA, atrial fibrillation on Eliquis, recent cystoscopy, left ureteroscopy with stone removal (1.3 cm), ureteroscopic laser lithotripsy, left ureteral stent placement with urology on 10/28/2023 with indwelling Foley catheter remaining in place who presents to the emergency department with left-sided flank pain and dark urine.  The patient denies any fevers, chills.  He states that he had been doing well postoperatively until the past day or so when he noticed much darker colored urine in his Foley bag.  No frank hematuria noted.  He has been drinking plenty of fluids to include water and Dr. Reino Kent.  This morning he developed sudden onset left-sided flank pain that felt similar to previous episodes of stones that he had been experiencing.  He has follow-up in 10 days with his urologist.  Pain lasted for around 1 hour and has since resolved.  No nausea or vomiting.  No cloudy colored urine in his Foley bag other than just darker colored urine.  Home Medications Prior to Admission medications   Medication Sig Start Date End Date Taking? Authorizing Provider  cefUROXime (CEFTIN) 500 MG tablet Take 1 tablet (500 mg total) by mouth 2 (two) times daily with a meal for 7 days. 11/06/23 11/13/23 Yes Ernie Avena, MD  ammonium lactate (AMLACTIN) 12 % cream Apply 1 Application topically See admin instructions. Apply to the soles of BOTH feet daily as directed    [provider]  atorvastatin (LIPITOR) 40 MG tablet Take 40 mg by mouth at bedtime.    [provider]  cholecalciferol (VITAMIN D3) 25 MCG (1000 UNIT) tablet Take 500 Units by mouth daily.    [provider]  ELIQUIS 5 MG TABS tablet Take 5 mg by mouth in the morning and at bedtime.    [provider]  glucosamine-chondroitin 500-400 MG tablet Take 1 tablet by mouth daily.    [provider]  hyoscyamine (ANASPAZ) 0.125 MG TBDP disintergrating tablet Place 1 tablet (0.125 mg total) under the tongue every 6 (six) hours as needed for up to 10 doses. 10/28/23   Adonis Brook, MD  losartan (COZAAR) 100 MG tablet Take 100 mg by mouth daily.    [provider]  metFORMIN (GLUCOPHAGE) 1000 MG tablet Take 1,000 mg by mouth daily with breakfast.    [provider]  metoprolol succinate (TOPROL-XL) 25 MG 24 hr tablet Take 25 mg by mouth in the morning and at bedtime.    [provider]  NON FORMULARY Take 1 capsule by mouth See admin instructions. [place] [employer] [place] Stone Breaker Kidney Stones Dissolver, Liver Detox, & Gallbladder Supplements 1500mg  per Serving [employer] Urinary Tract for Women & Men- Take 1 capsule by mouth once a day    [provider]  nystatin-triamcinolone (MYCOLOG II) cream Apply 1 application  topically 2 (two) times daily as needed (for irritation- affected areas). 11/23/17   [provider]  ondansetron (ZOFRAN) 4 MG tablet Take 1 tablet (4 mg total) by mouth every 6 (six) hours. Patient not taking: Reported on 10/13/2023 12/01/17   Donnetta Hutching, MD  oxyCODONE-acetaminophen (PERCOCET) 5-325 MG tablet Take 1  tablet by mouth every 4 (four) hours as needed. Patient not taking: Reported on 10/13/2023 12/01/17   Donnetta Hutching, MD  tamsulosin (FLOMAX) 0.4 MG CAPS capsule Take 0.8 mg by mouth in the morning.    [provider]  tamsulosin (FLOMAX) 0.4 MG CAPS capsule Take 1 capsule (0.4 mg total) by mouth daily after supper for 30 doses. 10/13/23 11/12/23  Adonis Brook, MD      Allergies    Other, Iodine, and Shellfish allergy    Review of Systems   Review of Systems   Genitourinary:  Positive for flank pain.  All other systems reviewed and are negative.   Physical Exam Updated Vital Signs BP 134/84   Pulse 82   Temp 98.2 F (36.8 C) (Oral)   Resp 16   SpO2 96%  Physical Exam Vitals and nursing note reviewed.  Constitutional:      General: He is not in acute distress.    Appearance: He is well-developed.  HENT:     Head: Normocephalic and atraumatic.  Eyes:     Conjunctiva/sclera: Conjunctivae normal.  Cardiovascular:     Rate and Rhythm: Normal rate and regular rhythm.  Pulmonary:     Effort: Pulmonary effort is normal. No respiratory distress.     Breath sounds: Normal breath sounds.  Abdominal:     Palpations: Abdomen is soft.     Tenderness: There is no abdominal tenderness.  Genitourinary:    Comments: Foley catheter in place draining clear but amber-colored urine Musculoskeletal:        General: No swelling.     Cervical back: Neck supple.  Skin:    General: Skin is warm and dry.     Capillary Refill: Capillary refill takes less than 2 seconds.  Neurological:     Mental Status: He is alert.  Psychiatric:        Mood and Affect: Mood normal.     ED Results / Procedures / Treatments   Labs (all labs ordered are listed, but only abnormal results are displayed) Labs Reviewed  URINALYSIS, ROUTINE W REFLEX MICROSCOPIC - Abnormal; Notable for the following components:      Result Value   APPearance CLOUDY (*)    Hgb urine dipstick LARGE (*)    Protein, ur 100 (*)    Leukocytes,Ua LARGE (*)    Bacteria, UA RARE (*)    All other components within normal limits  CBC WITH DIFFERENTIAL/PLATELET - Abnormal; Notable for the following components:   RBC 3.98 (*)    Hemoglobin 11.6 (*)    HCT 37.3 (*)    Platelets 149 (*)    Lymphs Abs 0.6 (*)    All other components within normal limits  BASIC METABOLIC PANEL WITH GFR - Abnormal; Notable for the following components:   Glucose, Bld 177 (*)    Calcium 8.2 (*)    All other  components within normal limits    EKG None  Radiology CT Renal Stone Study Result Date: 11/06/2023 CLINICAL DATA:  Abdominal/flank pain, stone suspected Patient reports right flank pain with dark urine. EXAM: CT ABDOMEN AND PELVIS WITHOUT CONTRAST TECHNIQUE: Multidetector CT imaging of the abdomen and pelvis was performed following the standard protocol without IV contrast. RADIATION DOSE REDUCTION: This exam was performed according to the departmental dose-optimization program which includes automated exposure control, adjustment of the mA and/or kV according to patient size and/or use of iterative reconstruction technique. COMPARISON:  CT 10/13/2023 FINDINGS: Lower chest: Mild left lower lobe  scarring.  No acute findings. Hepatobiliary: Unremarkable unenhanced appearance of the liver. Clips in the gallbladder fossa postcholecystectomy. No biliary dilatation. Pancreas: Mild fatty atrophy.  No ductal dilatation or inflammation. Spleen: Normal in size without focal abnormality. Adrenals/Urinary Tract: No adrenal nodule. Obstructing 8 x 6 mm stone in the distal right ureter (at the level of the mid sacrum) with moderate proximal hydroureteronephrosis. Moderate right perinephric stranding. There is a nonobstructing stone in the mid right kidney. Left nephroureteral stent in place. Proximal pigtail is in the lower renal pelvis, distal pigtail in the bladder. Previous proximal ureteral stone is no longer seen. Previous left hydronephrosis has improved. There is mild residual left perinephric fat stranding. No stone or stone fragments along the course of the stent. Decreased stone in the dependent urinary bladder. Stones measuring up to 5 mm persist. The bladder is decompressed by Foley catheter. Stomach/Bowel: Colonic diverticulosis. No diverticulitis. No obstruction or acute bowel inflammation. Small volume of stool in the colon. Normal appendix. There is a small duodenal diverticulum. Small hiatal hernia.  Vascular/Lymphatic: Aortic atherosclerosis and tortuosity. No aortic aneurysm. No abdominopelvic adenopathy. Reproductive: Prostate is unremarkable. Other: Prior left inguinal hernia repair. Diminutive fat containing umbilical hernia. No free air or ascites. Musculoskeletal: Multilevel degenerative change in the spine. There are no acute or suspicious osseous abnormalities. IMPRESSION: 1. Obstructing 8 x 6 mm stone in the distal right ureter with moderate proximal hydroureteronephrosis. 2. Left nephroureteral stent in place. Previous left hydronephrosis has improved. Previous proximal left ureteral stone is no longer seen. 3. Decreased stone in the dependent urinary bladder. Small stones measuring up to 5 mm persist. 4. Colonic diverticulosis without diverticulitis. Aortic Atherosclerosis (ICD10-I70.0). Electronically Signed   By: Narda Rutherford M.D.   On: 11/06/2023 16:32    Procedures Procedures    Medications Ordered in ED Medications  cefTRIAXone (ROCEPHIN) 2 g in sodium chloride 0.9 % 100 mL IVPB (2 g Intravenous New Bag/Given 11/06/23 1729)    ED Course/ Medical Decision Making/ A&P Clinical Course as of 11/06/23 1815  Sat Nov 06, 2023  1706 Bacteria, UA(!): RARE [JL]  1706 WBC, UA: >50 [JL]  1706 RBC / HPF: >50 [JL]  1706 Leukocytes,Ua(!): LARGE [JL]    Clinical Course User Index [JL] Ernie Avena, MD                                 Medical Decision Making Amount and/or Complexity of Data Reviewed Labs: ordered. Decision-making details documented in ED Course. Radiology: ordered.  Risk Prescription drug management.     83 year old male with medical history significant for CAD, DM2, HTN, HLD, CVA, atrial fibrillation on Eliquis, recent cystoscopy, left ureteroscopy with stone removal (1.3 cm), ureteroscopic laser lithotripsy, left ureteral stent placement with urology on 10/28/2023 with indwelling Foley catheter remaining in place who presents to the emergency department  with left-sided flank pain and dark urine.  The patient denies any fevers, chills.  He states that he had been doing well postoperatively until the past day or so when he noticed much darker colored urine in his Foley bag.  No frank hematuria noted.  He has been drinking plenty of fluids to include water and Dr. Reino Kent.  This morning he developed sudden onset left-sided flank pain that felt similar to previous episodes of stones that he had been experiencing.  He has follow-up in 10 days with his urologist.  Pain lasted for around 1 hour and  has since resolved.  No nausea or vomiting.  No cloudy colored urine in his Foley bag other than just darker colored urine.  On arrival, the patient was afebrile, not tachycardic or tachypneic, hemodynamically stable, saturating well in room air.  Physical exam generally unremarkable.  Patient overall well-appearing.  Presenting with intermittent pain postoperatively after ureteroscopic with stone removal and stent placement.  Considered recurrent stone, considered pyelonephritis, considered UTI.  CT imaging: IMPRESSION:  1. Obstructing 8 x 6 mm stone in the distal right ureter with  moderate proximal hydroureteronephrosis.  2. Left nephroureteral stent in place. Previous left hydronephrosis  has improved. Previous proximal left ureteral stone is no longer  seen.  3. Decreased stone in the dependent urinary bladder. Small stones  measuring up to 5 mm persist.  4. Colonic diverticulosis without diverticulitis.    Aortic Atherosclerosis (ICD10-I70.0).    Labs: CBC without a leukocytosis, mild anemia to 11.6, BMP with mild hyperglycemia 177 without an anion gap acidosis, no significant electrolyte abnormality, urinalysis with large leukocytes, negative nitrites, greater than 50 RBCs and WBCs, rare bacteria present.  This was after exchange of the patient's Foley catheter.  Was administered 2 g of IV Rocephin.  I consulted Dr. Alvester Morin of on-call urology and  reviewed the patient's presentation.  The patient remains pain-free at this time.  No AKI, not meeting SIRS criteria overall well-appearing.  Patient stable for discharge with close outpatient urology follow-up.  Informed the patient of the results of his diagnostic testing.  Will discharge the patient on a course of antibiotics, return precautions provided, stable for discharge at this time.  Final Clinical Impression(s) / ED Diagnoses Final diagnoses:  Lower urinary tract infectious disease  Nephrolithiasis    Rx / DC Orders ED Discharge Orders          Ordered    cefUROXime (CEFTIN) 500 MG tablet  2 times daily with meals        11/06/23 1810              Ernie Avena, MD 11/06/23 1815

## 2023-11-06 NOTE — Discharge Instructions (Addendum)
 Your CT revealed: IMPRESSION:  1. Obstructing 8 x 6 mm stone in the distal right ureter with  moderate proximal hydroureteronephrosis.  2. Left nephroureteral stent in place. Previous left hydronephrosis  has improved. Previous proximal left ureteral stone is no longer  seen.  3. Decreased stone in the dependent urinary bladder. Small stones  measuring up to 5 mm persist.  4. Colonic diverticulosis without diverticulitis.    Aortic Atherosclerosis (ICD10-I70.0).   We will treat you with antibiotics and have you follow-up closely with urology.

## 2023-11-06 NOTE — ED Triage Notes (Signed)
 Patient presents due to dark urine and right flank pain since yesterday. Last surgery was 10/28/23.

## 2023-11-10 ENCOUNTER — Other Ambulatory Visit: Payer: Self-pay | Admitting: Urology

## 2023-11-11 NOTE — Patient Instructions (Signed)
 DUE TO COVID-19 ONLY TWO VISITORS  (aged 82 and older)  ARE ALLOWED TO COME WITH YOU AND STAY IN THE WAITING ROOM ONLY DURING PRE OP AND PROCEDURE.   **NO VISITORS ARE ALLOWED IN THE SHORT STAY AREA OR RECOVERY ROOM!!**  IF YOU WILL BE ADMITTED INTO THE HOSPITAL YOU ARE ALLOWED ONLY FOUR SUPPORT PEOPLE DURING VISITATION HOURS ONLY (7 AM -8PM)   The support person(s) must pass our screening, gel in and out, and wear a mask at all times, including in the patient's room. Patients must also wear a mask when staff or their support person are in the room. Visitors GUEST BADGE MUST BE WORN VISIBLY  One adult visitor may remain with you overnight and MUST be in the room by 8 P.M.     Your procedure is scheduled on: 11/25/23   Report to [facility] Main Entrance    Report to admitting at : 7:00 AM   Call this number if you have problems the morning of surgery [phone]   Do not eat food or drink : After Midnight.  FOLLOW ANY ADDITIONAL PRE OP INSTRUCTIONS YOU RECEIVED FROM YOUR SURGEON'S OFFICE!!!   Oral Hygiene is also important to reduce your risk of infection.                                    Remember - BRUSH YOUR TEETH THE MORNING OF SURGERY WITH YOUR REGULAR TOOTHPASTE  DENTURES WILL BE REMOVED PRIOR TO SURGERY PLEASE DO NOT APPLY "Poly grip" OR ADHESIVES!!!   Do NOT smoke after Midnight   Take these medicines the morning of surgery with A SIP OF WATER: tamsulosin.  DO NOT TAKE ANY ORAL DIABETIC MEDICATIONS DAY OF YOUR SURGERY                              You may not have any metal on your body including hair pins, jewelry, and body piercing             Do not wear lotions, powders, perfumes/cologne, or deodorant              Men may shave face and neck.   Do not bring valuables to the hospital. [facility] IS NOT             RESPONSIBLE   FOR VALUABLES.   Contacts, glasses, or bridgework may not be worn into surgery.   Bring small overnight bag day of surgery.    DO NOT BRING YOUR HOME MEDICATIONS TO THE HOSPITAL. PHARMACY WILL DISPENSE MEDICATIONS LISTED ON YOUR MEDICATION LIST TO YOU DURING YOUR ADMISSION IN THE HOSPITAL!    Patients discharged on the day of surgery will not be allowed to drive home.  Someone NEEDS to stay with you for the first 24 hours after anesthesia.   Special Instructions: Bring a copy of your healthcare power of attorney and living will documents         the day of surgery if you haven't scanned them before.              Please read over the following fact sheets you were given: IF YOU HAVE QUESTIONS ABOUT YOUR PRE-OP INSTRUCTIONS PLEASE CALL [phone]    [facility] - Preparing for Surgery Before surgery, you can play an important role.  Because skin is not sterile, your skin  needs to be as free of germs as possible.  You can reduce the number of germs on your skin by washing with CHG (chlorahexidine gluconate) soap before surgery.  CHG is an antiseptic cleaner which kills germs and bonds with the skin to continue killing germs even after washing. Please DO NOT use if you have an allergy to CHG or antibacterial soaps.  If your skin becomes reddened/irritated stop using the CHG and inform your nurse when you arrive at Short Stay. Do not shave (including legs and underarms) for at least 48 hours prior to the first CHG shower.  You may shave your face/neck. Please follow these instructions carefully:  1.  Shower with CHG Soap the night before surgery and the  morning of Surgery.  2.  If you choose to wash your hair, wash your hair first as usual with your  normal  shampoo.  3.  After you shampoo, rinse your hair and body thoroughly to remove the  shampoo.                           4.  Use CHG as you would any other liquid soap.  You can apply chg directly  to the skin and wash                       Gently with a scrungie or clean washcloth.  5.  Apply the CHG Soap to your body ONLY FROM THE NECK DOWN.   Do not use on face/  open                           Wound or open sores. Avoid contact with eyes, ears mouth and genitals (private parts).                       Wash face,  Genitals (private parts) with your normal soap.             6.  Wash thoroughly, paying special attention to the area where your surgery  will be performed.  7.  Thoroughly rinse your body with warm water from the neck down.  8.  DO NOT shower/wash with your normal soap after using and rinsing off  the CHG Soap.                9.  Pat yourself dry with a clean towel.            10.  Wear clean pajamas.            11.  Place clean sheets on your bed the night of your first shower and do not  sleep with pets. Day of Surgery : Do not apply any lotions/deodorants the morning of surgery.  Please wear clean clothes to the hospital/surgery center.  FAILURE TO FOLLOW THESE INSTRUCTIONS MAY RESULT IN THE CANCELLATION OF YOUR SURGERY PATIENT SIGNATURE_________________________________  NURSE SIGNATURE__________________________________  ________________________________________________________________________

## 2023-11-12 ENCOUNTER — Encounter (HOSPITAL_COMMUNITY)
Admission: RE | Admit: 2023-11-12 | Discharge: 2023-11-12 | Disposition: A | Discharge status: Home / Self Care | Source: Ambulatory Visit | Attending: Urology | Admitting: Urology

## 2023-11-12 ENCOUNTER — Encounter (HOSPITAL_COMMUNITY): Payer: Self-pay

## 2023-11-12 ENCOUNTER — Other Ambulatory Visit: Payer: Self-pay

## 2023-11-12 VITALS — BP 145/96 | HR 91 | Temp 97.7°F | Ht 71.0 in | Wt 302.0 lb

## 2023-11-12 DIAGNOSIS — E119 Type 2 diabetes mellitus without complications: Secondary | ICD-10-CM | POA: Diagnosis not present

## 2023-11-12 DIAGNOSIS — Z01812 Encounter for preprocedural laboratory examination: Secondary | ICD-10-CM | POA: Insufficient documentation

## 2023-11-12 LAB — GLUCOSE, CAPILLARY: Glucose-Capillary: 146 mg/dL — ABNORMAL HIGH (ref 70–99)

## 2023-11-12 NOTE — Progress Notes (Signed)
 For Anesthesia: PCP - Fara Chute MD in [facility]  Cardiologist - Gwendlyn Deutscher at [facility] in [place]   Bowel Prep reminder:  Chest x-ray -  EKG - 10/13/23 Epic  Stress Test -  ECHO -  Cardiac Cath - 05/12/21 [facility]  Pacemaker/ICD device last checked: Pacemaker orders received: Device Rep notified:  Spinal Cord Stimulator:N/A  Sleep Study N/A CPAP -   Fasting Blood Sugar - N/A Checks Blood Sugar __0___ times a day Date and result of last Hgb A1c-  Last dose of GLP1 agonist- N/A GLP1 instructions:   Last dose of SGLT-2 inhibitors- N/A SGLT-2 instructions:   Blood Thinner Instructions:Eliquis: will be hold on: 11/22/23 Aspirin Instructions: Last Dose:  Activity level: Can go up a flight of stairs and activities of daily living without stopping and without chest pain and/or shortness of breath  Unable to go up a flight of stairs without shortness of breath     Anesthesia review: Hx: Afib, CAD, DM, HTN   Patient denies shortness of breath, fever, cough and chest pain at PAT appointment   Patient verbalized understanding of instructions that were given to them at the PAT appointment. Patient was also instructed that they will need to review over the PAT instructions again at home before surgery.

## 2023-11-24 NOTE — Anesthesia Preprocedure Evaluation (Signed)
 Anesthesia Evaluation  Patient identified by MRN, date of birth, ID band Patient awake    Reviewed: Allergy & Precautions, NPO status , Patient's Chart, lab work & pertinent test results  History of Anesthesia Complications (+) DIFFICULT AIRWAY, POST - OP SPINAL HEADACHE and history of anesthetic complications  Airway Mallampati: I  TM Distance: >3 FB Neck ROM: Full   Comment: Previous grade I views with Glidescope but also grade I view with Miller 2 and easy mask Dental  (+) Dental Advisory Given, Partial Upper   Pulmonary neg pulmonary ROS   Pulmonary exam normal breath sounds clear to auscultation       Cardiovascular hypertension (losartan, metoprolol), Pt. on medications and Pt. on home beta blockers (-) angina + CAD  (-) Past MI, (-) Cardiac Stents and (-) CABG + dysrhythmias Atrial Fibrillation  Rhythm:Regular Rate:Normal  HLD   Neuro/Psych neg Seizures CVA (2022, right vision deficits), Residual Symptoms    GI/Hepatic negative GI ROS,,,(+) Hepatitis -  Endo/Other  diabetes, Type 2, Oral Hypoglycemic Agents  Class 3 obesity  Renal/GU Renal disease     Musculoskeletal  (+) Arthritis ,    Abdominal  (+) + obese  Peds  Hematology negative hematology ROS (+) Lab Results      Component                Value               Date                      WBC                      5.5                 11/06/2023                HGB                      11.6 (L)            11/06/2023                HCT                      37.3 (L)            11/06/2023                MCV                      93.7                11/06/2023                PLT                      149 (L)             11/06/2023              Anesthesia Other Findings Last Eliquis: 11/22/2023  Reproductive/Obstetrics                              Anesthesia Physical Anesthesia Plan  ASA: 3  Anesthesia Plan: General   Post-op Pain  Management: Tylenol  PO (pre-op)*   Induction: Intravenous  PONV Risk Score and Plan: 2  and Ondansetron  and Dexamethasone   Airway Management Planned: LMA  Additional Equipment:   Intra-op Plan:   Post-operative Plan: Extubation in OR  Informed Consent: I have reviewed the patients History and Physical, chart, labs and discussed the procedure including the risks, benefits and alternatives for the proposed anesthesia with the patient or authorized representative who has indicated his/her understanding and acceptance.     Dental advisory given  Plan Discussed with: CRNA and Anesthesiologist  Anesthesia Plan Comments: (Risks of general anesthesia discussed including, but not limited to, sore throat, hoarse voice, chipped/damaged teeth, injury to vocal cords, nausea and vomiting, allergic reactions, lung infection, heart attack, stroke, and death. All questions answered. )         Anesthesia Quick Evaluation

## 2023-11-24 NOTE — H&P (Signed)
 83 year old male last seen in 2019 for kidney stone status post ureteroscopy with laser lithotripsy on the right side has also had a history as well prior to that. He is able to pass the stone fragments has not been seen since that time. He also has penile lesions for which he uses clotrimazole  occasionally. Patient having nausea, poor PO intake. No GH. Patient has pain at bilateral flanks. Pt had some broth and food at 5AM. CT shows left ureteral stone and bladder stones. Exam shows very buried penis.   PMH: Hx of CVA, Eliquis for atrial fibrillation, hx of DM2. No Pulm hx. Cardiologist Jolinda Necessary in [place] with [place].   Bladder stone:  10/18/23: s/p cystolithopaxy here fro VT toay, currently on Keflex, catheter was removed he urinated small amounts and came back after attempting to void and bladder scan was 0.    Nephrolithiasis:  10/18/23: L ureteral stone plan for L URS w/ LL  11/06/23: represented to ER w/ R ureteral stone  11/24/23: plan for URS today     ALLERGIES: Shellfish    MEDICATIONS: Cephalexin 500 mg capsule 1 capsule PO BID  Metformin Hcl 1,000 mg tablet  Simvastatin 10 mg tablet  Tamsulosin  Hcl 0.4 mg capsule  Aspir 81  Clotrimazole -Betamethasone  1 %-0.05 % cream  Diclofenac Sodium 50 mg tablet, delayed release  Glucosamine  Losartan Potassium 100 mg tablet  Nystatin  Vitamin B12  Vitamin D3     GU PSH: Cysto Remove Stent FB Sim - 2019 Lysis Penil Circumic Lesion - 2019 Ureteroscopic laser litho, Right - 2019     NON-GU PSH: Cholecystectomy (laparoscopic) Hernia Repair Knee Arthroscopy Visit Complexity (formerly GPC1X) - 10/13/2023     GU PMH: Bladder Stone - 10/13/2023 History of urolithiasis - 10/13/2023 Renal calculus - 2019, - 2019 Ureteral calculus - 2019    NON-GU PMH: Diabetes Type 2 Inflammatory liver disease, unspecified    FAMILY HISTORY: Breast Cancer - Mother Colon Cancer - Father Diabetes - Father Prostate Cancer -  Father stroke - Father    Notes: 3 sons  1 daughter   SOCIAL HISTORY: Marital Status: Married Preferred Language: English; Race: White Current Smoking Status: Patient has never smoked.   Tobacco Use Assessment Completed: Used Tobacco in last 30 days? Has never drank.  Drinks 1 caffeinated drink per day. Patient's occupation is/was retired.     Notes: 3 sons 1 daughter   REVIEW OF SYSTEMS:    GU Review Male:   Patient denies frequent urination, hard to postpone urination, burning/ pain with urination, get up at night to urinate, leakage of urine, stream starts and stops, trouble starting your stream, have to strain to urinate , erection problems, and penile pain.  Gastrointestinal (Upper):   Patient denies nausea, vomiting, and indigestion/ heartburn.  Gastrointestinal (Lower):   Patient denies diarrhea and constipation.  Constitutional:   Patient denies fever, night sweats, weight loss, and fatigue.  Skin:   Patient denies skin rash/ lesion and itching.  Eyes:   Patient denies blurred vision and double vision.  Ears/ Nose/ Throat:   Patient denies sore throat and sinus problems.  Hematologic/Lymphatic:   Patient denies swollen glands and easy bruising.  Cardiovascular:   Patient denies leg swelling and chest pains.  Respiratory:   Patient denies cough and shortness of breath.  Endocrine:   Patient denies excessive thirst.  Musculoskeletal:   Patient denies back pain and joint pain.  Neurological:   Patient denies headaches and dizziness.  Psychologic:  Patient denies depression and anxiety.   VITAL SIGNS: None   Complexity of Data:  Source Of History:  Patient  Records Review:   Previous Patient Records  Urodynamics Review:   Review Bladder Scan   PROCEDURES:         PVR Ultrasound - 45409        Voiding Trial - 51700  Instilled Volume: 17 cc         Visit Complexity - G2211    ASSESSMENT:      ICD-10 Details  1 GU:   Bladder Stone - N21.0 Acute, Stable  2    History of urolithiasis - Z87.442 Chronic, Stable     PLAN:    Patient has right ureteral stone diagnosed in the ED.  Will plan for right ureteroscopy with laser lithotripsy.  Patient has this performed well aware of the procedure.  His UA was not sent for culture he does have a catheter in patient was sent Bactrim on the 9/23 should take Bactrim night before procedure and morning of procedure.  We discussed risk benefits alternatives to ureteroscopy.  This included bleeding infection and damage to surrounding structures surrounding structures including ureter as well as urethra.  We discussed the need for stent postoperatively as well as the potential symptoms of stent placement.  We discussed possible inability to complete procedure due to caliber of your ureter or inability to pass stone possibly requiring long-term stent versus nephrostomy tube.  We discussed need for possible second surgery.  Patient voiced their understanding and consent was obtained.         Document Letter(s):  Created for Patient: Clinical Summary         Notes:

## 2023-11-25 ENCOUNTER — Encounter (HOSPITAL_COMMUNITY)
Admission: RE | Disposition: A | Discharge status: Home / Self Care | Payer: Self-pay | Source: Ambulatory Visit | Attending: Urology

## 2023-11-25 ENCOUNTER — Other Ambulatory Visit: Payer: Self-pay

## 2023-11-25 ENCOUNTER — Ambulatory Visit (HOSPITAL_BASED_OUTPATIENT_CLINIC_OR_DEPARTMENT_OTHER): Payer: Self-pay | Admitting: Anesthesiology

## 2023-11-25 ENCOUNTER — Ambulatory Visit (HOSPITAL_COMMUNITY): Payer: Self-pay | Admitting: Physician Assistant

## 2023-11-25 ENCOUNTER — Ambulatory Visit (HOSPITAL_COMMUNITY)
Admission: RE | Admit: 2023-11-25 | Discharge: 2023-11-25 | Disposition: A | Discharge status: Home / Self Care | Source: Ambulatory Visit | Attending: Urology | Admitting: Urology

## 2023-11-25 ENCOUNTER — Ambulatory Visit (HOSPITAL_COMMUNITY)

## 2023-11-25 ENCOUNTER — Encounter (HOSPITAL_COMMUNITY): Payer: Self-pay | Admitting: Urology

## 2023-11-25 DIAGNOSIS — E119 Type 2 diabetes mellitus without complications: Secondary | ICD-10-CM | POA: Insufficient documentation

## 2023-11-25 DIAGNOSIS — Z7984 Long term (current) use of oral hypoglycemic drugs: Secondary | ICD-10-CM | POA: Insufficient documentation

## 2023-11-25 DIAGNOSIS — N201 Calculus of ureter: Secondary | ICD-10-CM

## 2023-11-25 DIAGNOSIS — E66813 Obesity, class 3: Secondary | ICD-10-CM | POA: Insufficient documentation

## 2023-11-25 DIAGNOSIS — Z6841 Body Mass Index (BMI) 40.0 and over, adult: Secondary | ICD-10-CM | POA: Insufficient documentation

## 2023-11-25 DIAGNOSIS — Z87442 Personal history of urinary calculi: Secondary | ICD-10-CM | POA: Diagnosis not present

## 2023-11-25 DIAGNOSIS — I4891 Unspecified atrial fibrillation: Secondary | ICD-10-CM

## 2023-11-25 DIAGNOSIS — I2510 Atherosclerotic heart disease of native coronary artery without angina pectoris: Secondary | ICD-10-CM | POA: Diagnosis not present

## 2023-11-25 DIAGNOSIS — I10 Essential (primary) hypertension: Secondary | ICD-10-CM | POA: Insufficient documentation

## 2023-11-25 HISTORY — PX: CYSTOSCOPY/URETEROSCOPY/HOLMIUM LASER/STENT PLACEMENT: SHX6546

## 2023-11-25 LAB — GLUCOSE, CAPILLARY
Glucose-Capillary: 107 mg/dL — ABNORMAL HIGH (ref 70–99)
Glucose-Capillary: 111 mg/dL — ABNORMAL HIGH (ref 70–99)

## 2023-11-25 SURGERY — CYSTOSCOPY/URETEROSCOPY/HOLMIUM LASER/STENT PLACEMENT
Anesthesia: General | Site: Ureter | Laterality: Right

## 2023-11-25 MED ORDER — HYOSCYAMINE SULFATE 0.125 MG PO TBDP: 0.1250 mg | ORAL_TABLET | Freq: Four times a day (QID) | ORAL | 0 refills | Status: AC | PRN

## 2023-11-25 MED ORDER — TAMSULOSIN HCL 0.4 MG PO CAPS: 0.4000 mg | ORAL_CAPSULE | Freq: Every day | ORAL | 5 refills | Status: AC

## 2023-11-25 MED ORDER — ORAL CARE MOUTH RINSE: 15.0000 mL | Freq: Once | OROMUCOSAL | Status: AC

## 2023-11-25 MED ORDER — GENTAMICIN SULFATE 40 MG/ML IJ SOLN
5.0000 mg/kg | INTRAVENOUS | Status: AC
  Administered 2023-11-25: 680 mg via INTRAVENOUS
  Filled 2023-11-25: qty 17

## 2023-11-25 MED ORDER — CHLORHEXIDINE GLUCONATE 0.12 % MT SOLN
15.0000 mL | Freq: Once | OROMUCOSAL | Status: AC
  Administered 2023-11-25: 15 mL via OROMUCOSAL

## 2023-11-25 MED ORDER — DEXAMETHASONE SODIUM PHOSPHATE 10 MG/ML IJ SOLN
INTRAMUSCULAR | Status: DC | PRN
  Administered 2023-11-25: 4 mg via INTRAVENOUS

## 2023-11-25 MED ORDER — METHOCARBAMOL 750 MG PO TABS: 750.0000 mg | ORAL_TABLET | Freq: Four times a day (QID) | ORAL | 0 refills | Status: AC

## 2023-11-25 MED ORDER — AMISULPRIDE (ANTIEMETIC) 5 MG/2ML IV SOLN: 10.0000 mg | Freq: Once | INTRAVENOUS | Status: DC | PRN

## 2023-11-25 MED ORDER — PHENAZOPYRIDINE HCL 200 MG PO TABS
200.0000 mg | ORAL_TABLET | Freq: Three times a day (TID) | ORAL | 0 refills | Status: AC | PRN
Start: 2023-11-25 — End: ?

## 2023-11-25 MED ORDER — SODIUM CHLORIDE 0.9 % IV SOLN
1.0000 g | INTRAVENOUS | Status: AC
  Administered 2023-11-25: 2 g via INTRAVENOUS
  Filled 2023-11-25: qty 1000

## 2023-11-25 MED ORDER — PROPOFOL 10 MG/ML IV BOLUS
INTRAVENOUS | Status: AC
  Filled 2023-11-25: qty 20

## 2023-11-25 MED ORDER — FENTANYL CITRATE (PF) 100 MCG/2ML IJ SOLN
INTRAMUSCULAR | Status: AC
  Filled 2023-11-25: qty 2

## 2023-11-25 MED ORDER — PHENYLEPHRINE HCL-NACL 20-0.9 MG/250ML-% IV SOLN
INTRAVENOUS | Status: AC
  Filled 2023-11-25: qty 250

## 2023-11-25 MED ORDER — FENTANYL CITRATE PF 50 MCG/ML IJ SOSY: 25.0000 ug | PREFILLED_SYRINGE | INTRAMUSCULAR | Status: DC | PRN

## 2023-11-25 MED ORDER — TETRACYCLINE HCL 500 MG PO TABS: 500.0000 mg | ORAL_TABLET | Freq: Two times a day (BID) | ORAL | 0 refills | Status: DC

## 2023-11-25 MED ORDER — LIDOCAINE HCL (CARDIAC) PF 100 MG/5ML IV SOSY
PREFILLED_SYRINGE | INTRAVENOUS | Status: DC | PRN
  Administered 2023-11-25: 100 mg via INTRAVENOUS

## 2023-11-25 MED ORDER — PHENYLEPHRINE HCL (PRESSORS) 10 MG/ML IV SOLN
INTRAVENOUS | Status: DC | PRN
  Administered 2023-11-25: 100 ug via INTRAVENOUS
  Administered 2023-11-25 (×4): 80 ug via INTRAVENOUS

## 2023-11-25 MED ORDER — IOHEXOL 300 MG/ML  SOLN
INTRAMUSCULAR | Status: DC | PRN
  Administered 2023-11-25: 20 mL

## 2023-11-25 MED ORDER — PHENYLEPHRINE 80 MCG/ML (10ML) SYRINGE FOR IV PUSH (FOR BLOOD PRESSURE SUPPORT)
PREFILLED_SYRINGE | INTRAVENOUS | Status: AC
  Filled 2023-11-25: qty 10

## 2023-11-25 MED ORDER — PROPOFOL 10 MG/ML IV BOLUS
INTRAVENOUS | Status: DC | PRN
  Administered 2023-11-25: 50 mg via INTRAVENOUS
  Administered 2023-11-25: 150 mg via INTRAVENOUS

## 2023-11-25 MED ORDER — ONDANSETRON HCL 4 MG/2ML IJ SOLN
INTRAMUSCULAR | Status: AC
  Filled 2023-11-25: qty 2

## 2023-11-25 MED ORDER — LACTATED RINGERS IV SOLN: INTRAVENOUS | Status: DC

## 2023-11-25 MED ORDER — ONDANSETRON HCL 4 MG/2ML IJ SOLN
INTRAMUSCULAR | Status: DC | PRN
  Administered 2023-11-25: 4 mg via INTRAVENOUS

## 2023-11-25 MED ORDER — OXYCODONE HCL 5 MG/5ML PO SOLN: 5.0000 mg | Freq: Once | ORAL | Status: DC | PRN

## 2023-11-25 MED ORDER — OXYCODONE HCL 5 MG PO TABS: 5.0000 mg | ORAL_TABLET | Freq: Once | ORAL | Status: DC | PRN

## 2023-11-25 MED ORDER — ACETAMINOPHEN 500 MG PO TABS
1000.0000 mg | ORAL_TABLET | Freq: Once | ORAL | Status: AC
  Administered 2023-11-25: 1000 mg via ORAL
  Filled 2023-11-25: qty 2

## 2023-11-25 MED ORDER — LIDOCAINE HCL (PF) 2 % IJ SOLN
INTRAMUSCULAR | Status: AC
  Filled 2023-11-25: qty 5

## 2023-11-25 MED ORDER — SODIUM CHLORIDE 0.9 % IR SOLN
Status: DC | PRN
  Administered 2023-11-25: 1000 mL

## 2023-11-25 MED ORDER — INSULIN ASPART 100 UNIT/ML IJ SOLN: 0.0000 [IU] | INTRAMUSCULAR | Status: DC | PRN

## 2023-11-25 MED ORDER — FENTANYL CITRATE (PF) 100 MCG/2ML IJ SOLN
INTRAMUSCULAR | Status: DC | PRN
  Administered 2023-11-25 (×2): 25 ug via INTRAVENOUS

## 2023-11-25 MED ORDER — DEXAMETHASONE SODIUM PHOSPHATE 10 MG/ML IJ SOLN
INTRAMUSCULAR | Status: AC
  Filled 2023-11-25: qty 1

## 2023-11-25 SURGICAL SUPPLY — 24 items
BAG URINE DRAIN 2000ML AR STRL (UROLOGICAL SUPPLIES) IMPLANT
BAG URO CATCHER STRL LF (MISCELLANEOUS) ×1 IMPLANT
BASKET ZERO TIP NITINOL 2.4FR (BASKET) IMPLANT
CATH FOLEY 2WAY SLVR 5CC 18FR (CATHETERS) IMPLANT
CATH URETL OPEN 5X70 (CATHETERS) ×1 IMPLANT
CLOTH BEACON ORANGE TIMEOUT ST (SAFETY) ×1 IMPLANT
EXTRACTOR STONE 1.7FRX115CM (UROLOGICAL SUPPLIES) IMPLANT
FIBER LASER MOSES 200 DFL (Laser) IMPLANT
FIBER LASER MOSES 365 DFL (Laser) IMPLANT
GLOVE SURG LX STRL 8.0 MICRO (GLOVE) ×1 IMPLANT
GOWN STRL REUS W/ TWL XL LVL3 (GOWN DISPOSABLE) ×1 IMPLANT
GUIDEWIRE ANG ZIPWIRE 038X150 (WIRE) IMPLANT
GUIDEWIRE STR DUAL SENSOR (WIRE) ×1 IMPLANT
HOLDER FOLEY CATH W/STRAP (MISCELLANEOUS) IMPLANT
KIT TURNOVER KIT A (KITS) IMPLANT
MANIFOLD NEPTUNE II (INSTRUMENTS) ×1 IMPLANT
PACK CYSTO (CUSTOM PROCEDURE TRAY) ×1 IMPLANT
SHEATH NAVIGATOR HD 12/14X28 (SHEATH) IMPLANT
SHEATH NAVIGATOR HD 12/14X36 (SHEATH) IMPLANT
STENT URET 6FRX26 CONTOUR (STENTS) IMPLANT
STRIP CLOSURE SKIN 1/2X4 (GAUZE/BANDAGES/DRESSINGS) IMPLANT
SYR 10ML LL (SYRINGE) IMPLANT
TUBING CONNECTING 10 (TUBING) ×1 IMPLANT
TUBING UROLOGY SET (TUBING) ×1 IMPLANT

## 2023-11-25 NOTE — Op Note (Signed)
 Preoperative diagnosis: right ureteral calculus  Postoperative diagnosis: right ureteral calculus  Procedure:  Cystoscopy right ureteroscopy and stone removal Ureteroscopic laser lithotripsy right 15F x 26 ureteral stent placement  right retrograde pyelography with interpretation  Surgeon: Dr. Aimee Alf  Anesthesia: General  Complications: None  Intraoperative findings: Distal right ureteral stone fragmented and extracted Some impaction noted from the stone at the right distal ureter Left stent in place removed 6 x 26 double-J stent with strings placed in the right ureter  Retrograde pyelogram interpretation: Area of impaction of the distal right ureter noted narrowing within the ureter.  No contrast extravasation.  EBL: Minimal  Specimens: right ureteral calculus  Disposition of specimens: [facility] for stone analysis  Indication: Jay Floyd is a 83 y.o.   patient with a long history of kidney stones previously had left ureteroscopy now with right ureteral stone and associated right symptoms. After reviewing the management options for treatment, the patient elected to proceed with the above surgical procedure(s). We have discussed the potential benefits and risks of the procedure, side effects of the proposed treatment, the likelihood of the patient achieving the goals of the procedure, and any potential problems that might occur during the procedure or recuperation. Informed consent has been obtained.   Description of procedure:  The patient was taken to the operating room and general anesthesia was induced.  The patient was placed in the dorsal lithotomy position, prepped and draped in the usual sterile fashion, and preoperative antibiotics were administered. A preoperative time-out was performed.   Cystourethroscopy was performed.  The patient's urethra was examined and was normal.the patient's prostate demonstrated bilobar prostatic  hypertrophy. Pan cystoscopy was performed and the bladder systematically examined in its entirety. There was no evidence for any bladder tumors, stones, or other mucosal pathology.    Attention then turned to the right ureteral orifice and a ureteral catheter was used to intubate the ureteral orifice.   A 0.38 sensor guidewire was then advanced up the right ureter into the renal pelvis under fluoroscopic guidance. The 6 Fr semirigid ureteroscope was then advanced into the ureter next to the guidewire and the calculus was identified.   The stone was then fragmented with the 365 micron holmium laser fiber on a setting of 0.5 and frequency of 5 Hz.   All stones were then removed from the ureter with an N-gage nitinol basket.  Reinspection of the ureter revealed no remaining visible stones or fragments.   A retrograde pyelogram was then performed demonstrating the findings as noted above.  The residual stones within the bladder were evacuated using the cystoscope  Once the bladder was drained the stent was then loaded over the wire and the wire was placed under fluoroscopic guidance proper curl within the kidney and the bladder was confirmed with fluoroscopy.  Leaving the string attached.  An 75 French catheter was then placed in the bladder 10 cc of within the balloon the strings were then taped to the catheter.    The patient appeared to tolerate the procedure well and without complications.  The patient was able to be awakened and transferred to the recovery unit in satisfactory condition.   Disposition: The tether the stent was left on and taped to the catheter.  Patient will follow-up in 4 weeks for void trial during withdrawal stent will be removed.  Renal ultrasound performed at that visit as well.

## 2023-11-25 NOTE — Discharge Instructions (Addendum)
 DISCHARGE INSTRUCTIONS FOR Ureteroscopy and/or Ureteral Stent Placement  MEDICATIONS:  1.  Robaxin  2. Tamsulosin   3. Hyoscyamine   4. Methocarbamol   5. Tetracycline    ACTIVITY:  1. No strenuous activity x 1week  2. No driving while on narcotic pain medications  3. Drink plenty of water   4. Continue to walk at home - it is normal to see blood in the urine while the stent is in place, so keep active, but don't over do it.  5. May return to work/school tomorrow or when you feel ready  6. You may experience some pain when urinating in the kidney on the side that was operated on while the stent is in place this is normal  WHAT IS NORMAL TO EXPERIENCE: It is normal to feel the urge to urinate while the stent is in place It is normal to have blood in your urine while the stent is in place  It sometimes can be normal to have pain in your kidney when you urinate   BATHING:  1. You can shower and we recommend daily showers  2. You have a string coming from your urethra: The stent string is attached to your ureteral stent. Do not pull on this until follow up.   DIET: You may return to your normal diet immediately. Because of the raw surface of your bladder, alcohol , spicy foods, acid type foods and drinks with caffeine may cause irritation or frequency and should be used in moderation. To keep your urine flowing freely and to avoid constipation, drink plenty of fluids during the day ( 8-10 glasses ). Tip: Avoid cranberry juice because it is very acidic.  SIGNS/SYMPTOMS TO CALL:  Please call us  if you have a fever greater than 101.5, uncontrolled nausea/vomiting, uncontrolled pain, dizziness, unable to urinate, bloody urine with clots greater than the size of a quarter, chest pain, shortness of breath, leg swelling, leg pain, redness around wound, drainage from wound, or any other concerns or questions.   You can reach us  at [phone].   FOLLOW-UP:  1. You have a follow up in 4 weeks for  void trial and stent removal with renal ultrasound.

## 2023-11-25 NOTE — Anesthesia Procedure Notes (Signed)
 Procedure Name: LMA Insertion Date/Time: 11/25/2023 9:18 AM  Performed by: Conard Decent, MDPre-anesthesia Checklist: Patient identified, Emergency Drugs available, Suction available and Patient being monitored Patient Re-evaluated:Patient Re-evaluated prior to induction Oxygen Delivery Method: Circle system utilized Preoxygenation: Pre-oxygenation with 100% oxygen Induction Type: IV induction Ventilation: Mask ventilation without difficulty LMA: LMA inserted LMA Size: 5.0 Tube type: Oral Number of attempts: 1 Placement Confirmation: positive ETCO2 and breath sounds checked- equal and bilateral Tube secured with: Tape Dental Injury: Teeth and Oropharynx as per pre-operative assessment

## 2023-11-25 NOTE — Transfer of Care (Signed)
 Immediate Anesthesia Transfer of Care Note  Patient: Jay Floyd  Procedure(s) Performed: CYSTOSCOPY/URETEROSCOPY/HOLMIUM LASER/STENT PLACEMENT (Right: Ureter)  Patient Location: PACU  Anesthesia Type:General  Level of Consciousness: awake, alert , oriented, and patient cooperative  Airway & Oxygen Therapy: Patient Spontanous Breathing and Patient connected to face mask oxygen  Post-op Assessment: Report given to RN and Post -op Vital signs reviewed and stable  Post vital signs: Reviewed and stable  Last Vitals:  Vitals Value Taken Time  BP 141/88 11/25/23 1013  Temp    Pulse 79 11/25/23 1014  Resp 13 11/25/23 1014  SpO2 94 % 11/25/23 1014  Vitals shown include unfiled device data.  Last Pain:  Vitals:   11/25/23 0746  TempSrc:   PainSc: 0-No pain         Complications: No notable events documented.

## 2023-11-25 NOTE — Anesthesia Postprocedure Evaluation (Signed)
 Anesthesia Post Note  Patient: Jay Floyd  Procedure(s) Performed: CYSTOSCOPY/URETEROSCOPY/HOLMIUM LASER/STENT PLACEMENT (Right: Ureter)     Patient location during evaluation: PACU Anesthesia Type: General Level of consciousness: awake Pain management: pain level controlled Vital Signs Assessment: post-procedure vital signs reviewed and stable Respiratory status: spontaneous breathing, nonlabored ventilation and respiratory function stable Cardiovascular status: blood pressure returned to baseline and stable Postop Assessment: no apparent nausea or vomiting Anesthetic complications: no   No notable events documented.  Last Vitals:  Vitals:   11/25/23 1052 11/25/23 1100  BP: (!) 144/101 (!) 141/96  Pulse: 75 86  Resp:    Temp: 36.4 C   SpO2: 91% 93%    Last Pain:  Vitals:   11/25/23 1052  TempSrc: Oral  PainSc: 0-No pain                 Conard Decent

## 2023-11-26 ENCOUNTER — Emergency Department (HOSPITAL_COMMUNITY)
Admission: EM | Admit: 2023-11-26 | Discharge: 2023-11-26 | Disposition: A | Discharge status: Home / Self Care | Attending: Emergency Medicine | Admitting: Emergency Medicine

## 2023-11-26 ENCOUNTER — Other Ambulatory Visit: Payer: Self-pay

## 2023-11-26 ENCOUNTER — Emergency Department (HOSPITAL_COMMUNITY)

## 2023-11-26 ENCOUNTER — Encounter (HOSPITAL_COMMUNITY): Payer: Self-pay | Admitting: Urology

## 2023-11-26 DIAGNOSIS — T83122S Displacement of urinary stent, sequela: Secondary | ICD-10-CM | POA: Diagnosis not present

## 2023-11-26 DIAGNOSIS — Z7901 Long term (current) use of anticoagulants: Secondary | ICD-10-CM | POA: Diagnosis not present

## 2023-11-26 DIAGNOSIS — I4891 Unspecified atrial fibrillation: Secondary | ICD-10-CM | POA: Diagnosis not present

## 2023-11-26 DIAGNOSIS — T83123A Displacement of other urinary stents, initial encounter: Secondary | ICD-10-CM | POA: Diagnosis not present

## 2023-11-26 DIAGNOSIS — R319 Hematuria, unspecified: Secondary | ICD-10-CM | POA: Insufficient documentation

## 2023-11-26 DIAGNOSIS — Z7984 Long term (current) use of oral hypoglycemic drugs: Secondary | ICD-10-CM | POA: Diagnosis not present

## 2023-11-26 DIAGNOSIS — Z0389 Encounter for observation for other suspected diseases and conditions ruled out: Secondary | ICD-10-CM | POA: Diagnosis not present

## 2023-11-26 DIAGNOSIS — T83091A Other mechanical complication of indwelling urethral catheter, initial encounter: Secondary | ICD-10-CM | POA: Diagnosis not present

## 2023-11-26 DIAGNOSIS — E119 Type 2 diabetes mellitus without complications: Secondary | ICD-10-CM | POA: Diagnosis not present

## 2023-11-26 DIAGNOSIS — T839XXA Unspecified complication of genitourinary prosthetic device, implant and graft, initial encounter: Secondary | ICD-10-CM

## 2023-11-26 DIAGNOSIS — Z79899 Other long term (current) drug therapy: Secondary | ICD-10-CM | POA: Diagnosis not present

## 2023-11-26 DIAGNOSIS — T83098A Other mechanical complication of other indwelling urethral catheter, initial encounter: Secondary | ICD-10-CM | POA: Insufficient documentation

## 2023-11-26 DIAGNOSIS — N201 Calculus of ureter: Secondary | ICD-10-CM | POA: Diagnosis not present

## 2023-11-26 DIAGNOSIS — I2510 Atherosclerotic heart disease of native coronary artery without angina pectoris: Secondary | ICD-10-CM | POA: Diagnosis not present

## 2023-11-26 DIAGNOSIS — I10 Essential (primary) hypertension: Secondary | ICD-10-CM | POA: Diagnosis not present

## 2023-11-26 LAB — URINALYSIS, W/ REFLEX TO CULTURE (INFECTION SUSPECTED)
Bilirubin Urine: NEGATIVE
Glucose, UA: NEGATIVE mg/dL
Ketones, ur: NEGATIVE mg/dL
Nitrite: NEGATIVE
Protein, ur: 100 mg/dL — AB
Specific Gravity, Urine: 1.013 (ref 1.005–1.030)
pH: 5 (ref 5.0–8.0)

## 2023-11-26 MED ORDER — DOXYCYCLINE HYCLATE 100 MG PO TABS
100.0000 mg | ORAL_TABLET | Freq: Once | ORAL | Status: AC
Start: 2023-11-26 — End: 2023-11-26
  Administered 2023-11-26: 100 mg via ORAL
  Filled 2023-11-26: qty 1

## 2023-11-26 MED ORDER — DOXYCYCLINE HYCLATE 100 MG PO CAPS: 100.0000 mg | ORAL_CAPSULE | Freq: Two times a day (BID) | ORAL | 0 refills | Status: AC

## 2023-11-26 NOTE — Consult Note (Signed)
 I have been asked to see the patient by Dr. Artemio Bilberry, for evaluation and management of gross hematuria.  History of present illness: 83 year old male with a history of nephrolithiasis as well as bladder stone in March patient had removal of his bladder stone as well as ureteroscopy.  Patient had recurrence of right nephrolithiasis and had ureteroscopy with laser lithotripsy on 11/25/2023.  Overnight his catheter stopped draining and he had some gross hematuria came today because catheter is now draining.  Of note after the ureteroscopy the stent was taped to the catheter.  The ER remove the catheter pulling the stent into the urethra and balloon stent down the distal ureter.  At this point my review the patient denies any nausea vomiting fevers or chills.  His urine in the catheter is clear yellow.  KUB demonstrates a stent in the distal right ureter as well as probably urethra.   Review of systems: A 12 point comprehensive review of systems was obtained and is negative unless otherwise stated in the history of present illness.  There are no active problems to display for this patient.   No current facility-administered medications on file prior to encounter.   Current Outpatient Medications on File Prior to Encounter  Medication Sig Dispense Refill   ammonium lactate (AMLACTIN) 12 % cream Apply 1 Application topically as needed for dry skin. Apply to both feet     atorvastatin (LIPITOR) 40 MG tablet Take 40 mg by mouth at bedtime.     cephALEXin (KEFLEX) 500 MG capsule Take 500 mg by mouth 2 (two) times daily.     ELIQUIS 5 MG TABS tablet Take 5 mg by mouth in the morning and at bedtime.     hyoscyamine  (ANASPAZ ) 0.125 MG TBDP disintergrating tablet Place 1 tablet (0.125 mg total) under the tongue every 6 (six) hours as needed for up to 10 doses. (Patient not taking: Reported on 11/12/2023) 10 tablet 0   hyoscyamine  (ANASPAZ ) 0.125 MG TBDP disintergrating tablet Place 1 tablet (0.125 mg total)  under the tongue every 6 (six) hours as needed for up to 20 doses. 20 tablet 0   losartan (COZAAR) 100 MG tablet Take 100 mg by mouth daily.     metFORMIN (GLUCOPHAGE) 1000 MG tablet Take 1,000 mg by mouth daily with breakfast.     methocarbamol  (ROBAXIN ) 750 MG tablet Take 1 tablet (750 mg total) by mouth 4 (four) times daily for 20 doses. 20 tablet 0   metoprolol succinate (TOPROL-XL) 25 MG 24 hr tablet Take 25 mg by mouth in the morning and at bedtime.     NON FORMULARY Take 1 capsule by mouth See admin instructions. [place] [employer]aturals [place] Stone Breaker Kidney Stones Dissolver, Liver Detox, & Gallbladder Supplements 1500mg  per Serving [employer] Tract for Women [employer] Men- Take 1 capsule by mouth once a day (Patient not taking: Reported on 11/12/2023)     nystatin-triamcinolone (MYCOLOG II) cream Apply 1 application  topically 2 (two) times daily as needed (for irritation- affected areas).  3   ondansetron  (ZOFRAN ) 4 MG tablet Take 1 tablet (4 mg total) by mouth every 6 (six) hours. (Patient not taking: Reported on 10/13/2023) 12 tablet 0   oxyCODONE -acetaminophen  (PERCOCET) 5-325 MG tablet Take 1 tablet by mouth every 4 (four) hours as needed. (Patient not taking: Reported on 10/13/2023) 20 tablet 0   phenazopyridine  (PYRIDIUM ) 200 MG tablet Take 1 tablet (200 mg total) by mouth 3 (three) times daily as needed for up to  6 doses. 6 tablet 0   tamsulosin  (FLOMAX ) 0.4 MG CAPS capsule Take 1 capsule (0.4 mg total) by mouth daily after supper. 30 capsule 5   Tetracycline  HCl 500 MG TABS Take 500 mg by mouth 2 (two) times daily for 10 doses. 10 each 0    Past Medical History:  Diagnosis Date   Arthritis    Atrial fibrillation (HCC)    Carcinoma (HCC)    skin   Coronary artery disease    Diabetes mellitus without complication (HCC)    TYPE 2   Difficult intubation    HX OF 10-15 YEARS AGO HAD SURGERY SINCE 2013 after a kidney stent pt. woke up with a broken collar bone  and rib and it is inconclusive to why.   Dyspnea    Dysrhythmia    Hepatitis    History of kidney stones    Hypercholesteremia    Hypertension    preventive   Renal disorder    Spinal headache    Stroke [facility])     Past Surgical History:  Procedure Laterality Date   ACHILLES TENDON REPAIR     ACHILLES TENDON REPAIR     bil   CHOLECYSTECTOMY     CIRCUMCISION     CLAVICLE SURGERY     cystocopy     12-13-17 Dr. Parke Boll  ureteral stone   CYSTOSCOPY W/ URETERAL STENT PLACEMENT N/A 10/13/2023   Procedure: CYSTOSCOPY, WITH RETROGRADE PYELOGRAM AND URETERAL STENT INSERTION;  Surgeon: Thelbert Finner, MD;  Location: [facility];  Service: Urology;  Laterality: N/A;   CYSTOSCOPY WITH LITHOLAPAXY N/A 10/13/2023   Procedure: CYSTOSCOPY, WITH BLADDER CALCULUS LITHOLAPAXY;  Surgeon: Thelbert Finner, MD;  Location: [facility];  Service: Urology;  Laterality: N/A;   CYSTOSCOPY WITH RETROGRADE PYELOGRAM, URETEROSCOPY AND STENT PLACEMENT Right 12/13/2017   Procedure: CYSTOSCOPY WITH RIGHT  RETROGRADE PYELOGRAM, URETEROSCOPY AND STENT PLACEMENT;  Surgeon: Samson Croak, MD;  Location: [facility];  Service: Urology;  Laterality: Right;   CYSTOSCOPY/URETEROSCOPY/HOLMIUM LASER/STENT PLACEMENT Left 10/28/2023   Procedure: CYSTOSCOPY/URETEROSCOPY/HOLMIUM LASER/LEFT STENT REMOVAL/LEFT STENT PLACEMENT;  Surgeon: Thelbert Finner, MD;  Location: [facility];  Service: Urology;  Laterality: Left;  WITH RETROGRADE PYELOGRAM   CYSTOSCOPY/URETEROSCOPY/HOLMIUM LASER/STENT PLACEMENT Right 11/25/2023   Procedure: CYSTOSCOPY/URETEROSCOPY/HOLMIUM LASER/STENT PLACEMENT;  Surgeon: Thelbert Finner, MD;  Location: [facility];  Service: Urology;  Laterality: Right;  RETROGRADE PYLEGRAM NEEDED   HERNIA REPAIR     ingunial   HOLMIUM LASER APPLICATION Right 12/13/2017   Procedure: HOLMIUM LASER APPLICATION;  Surgeon: Samson Croak, MD;  Location: [facility];  Service: Urology;  Laterality: Right;   left hand surgery     table saw  accident 2017   LITHOTRIPSY     TOTAL KNEE ARTHROPLASTY Bilateral     Social History   Tobacco Use   Smoking status: Never    Passive exposure: Never   Smokeless tobacco: Never  Vaping Use   Vaping status: Never Used  Substance Use Topics   Alcohol  use: Never   Drug use: Never    History reviewed. No pertinent family history.  PE: Vitals:   11/26/23 0630 11/26/23 0635  BP: (!) 140/112   Pulse: 90   Resp: 20   Temp: 97.7 F (36.5 C)   TempSrc: Oral   SpO2: 98%   Weight:  136.1 kg  Height:  5\' 11"  (1.803 m)   Patient appears to be in no acute distress  patient is alert and oriented x3 Respiratory: Normal  work breathing room air Cardiovascular: Regular and rhythm per monitor GU: 18 French Foley clear yellow urine in the catheter  No results for input(s): "WBC", "HGB", "HCT" in the last 72 hours. No results for input(s): "NA", "K", "CL", "CO2", "GLUCOSE", "BUN", "CREATININE", "CALCIUM" in the last 72 hours. No results for input(s): "LABPT", "INR" in the last 72 hours. No results for input(s): "LABURIN" in the last 72 hours. No results found for this or any previous visit.  Imaging: KUB demonstrates a stent in the distal right ureter pulled into the urethra.  Imp: 83 year old male who return to the ER for gross hematuria catheter occlusion.  Catheter was exchanged and urine is now clear.  After ureteroscopy yesterday stent was taped to the catheter Foley catheter was exchanged stent was pulled in the urethra distal ureter.  Patient has no signs of infection.  Stent was intended to today stay in until catheter was exchanged in 4 weeks.  Recommendations: - Nurse practitioner Cam Sattenfield will come by and remove the stent - Please discharge on doxycycline  or tetracycline  for total of 5 days - Please give return precautions that if patient gets fevers chills nausea vomiting he needs to return to the ER immediately for possible ureteral stent  Will follow-up in 4  weeks for catheter exchange   Thank you for involving me in this patient's care, I will continue to follow along.Please page with any further questions or concerns. Thelbert Finner

## 2023-11-26 NOTE — ED Notes (Signed)
 The catheter was removed and replaced with a 96F. The catheter was then irrigated. There was still drainage from around the catheter. The provider was notified of same.

## 2023-11-26 NOTE — ED Triage Notes (Addendum)
 Pt states that he recently had foley placed here on yesterday after having a Ureteroscopy procedure and ureteral stents placed. Pt endorses foley catheter is now leaking and possible blood in urine. Urine noted to be dark and red here in triage. Denies pain,fever or chills.Blood thinner(Eliquis) stopped prior to procedure,d/t resume on today.

## 2023-11-26 NOTE — Discharge Instructions (Signed)
 Your antibiotic has been sent to the pharmacy.  Please follow up with urology as scheduled.

## 2023-11-26 NOTE — ED Provider Notes (Signed)
 [place] EMERGENCY DEPARTMENT AT [facility] Provider Note   CSN: [id number] Arrival date & time: 11/26/23  4098     History  Chief Complaint  Patient presents with   Indwelling Catheter Complications    Jay Floyd is a 83 y.o. male with hx of afib on eliquis, CAD, diabetes, HTN, who presents to the ER with foley catheter concern. Pt had ureteroscopy with ureteral stents placed yesterday with urology. Today reports foley catheter is now leaking and some blood in the urine. Denies any pain, fever or chills. Had discontinued eliquis prior to procedure, scheduled to restart today. He woke up in a pool of blood this AM.   Son reports patient was supposed to be taking an antibiotic but he thinks patient was taking a muscle relaxer instead accidentally. Pt has not restarted his eliquis yet.   HPI     Home Medications Prior to Admission medications   Medication Sig Start Date End Date Taking? Authorizing Provider  doxycycline  (VIBRAMYCIN ) 100 MG capsule Take 1 capsule (100 mg total) by mouth 2 (two) times daily for 5 days. 11/26/23 12/01/23 Yes Lamont Glasscock T, PA-C  ammonium lactate (AMLACTIN) 12 % cream Apply 1 Application topically as needed for dry skin. Apply to both feet    [provider]  atorvastatin (LIPITOR) 40 MG tablet Take 40 mg by mouth at bedtime.    [provider]  cephALEXin (KEFLEX) 500 MG capsule Take 500 mg by mouth 2 (two) times daily. 10/16/23   [provider]  ELIQUIS 5 MG TABS tablet Take 5 mg by mouth in the morning and at bedtime.    [provider]  hyoscyamine  (ANASPAZ ) 0.125 MG TBDP disintergrating tablet Place 1 tablet (0.125 mg total) under the tongue every 6 (six) hours as needed for up to 10 doses. Patient not taking: Reported on 11/12/2023 10/28/23   Thelbert Finner, MD  hyoscyamine  (ANASPAZ ) 0.125 MG TBDP disintergrating tablet Place 1 tablet (0.125 mg total) under the tongue every 6 (six) hours as  needed for up to 20 doses. 11/25/23   Thelbert Finner, MD  losartan (COZAAR) 100 MG tablet Take 100 mg by mouth daily.    [provider]  metFORMIN (GLUCOPHAGE) 1000 MG tablet Take 1,000 mg by mouth daily with breakfast.    [provider]  methocarbamol  (ROBAXIN ) 750 MG tablet Take 1 tablet (750 mg total) by mouth 4 (four) times daily for 20 doses. 11/25/23 11/30/23  Thelbert Finner, MD  metoprolol succinate (TOPROL-XL) 25 MG 24 hr tablet Take 25 mg by mouth in the morning and at bedtime.    [provider]  NON FORMULARY Take 1 capsule by mouth See admin instructions. [place] [employer] [place] Stone Breaker Kidney Stones Dissolver, Liver Detox, & Gallbladder Supplements 1500mg  per Serving [employer]- Take 1 capsule by mouth once a day Patient not taking: Reported on 11/12/2023    [provider]  nystatin-triamcinolone (MYCOLOG II) cream Apply 1 application  topically 2 (two) times daily as needed (for irritation- affected areas). 11/23/17   [provider]  ondansetron  (ZOFRAN ) 4 MG tablet Take 1 tablet (4 mg total) by mouth every 6 (six) hours. Patient not taking: Reported on 10/13/2023 12/01/17   Latanya Poisson, MD  oxyCODONE -acetaminophen  (PERCOCET) 5-325 MG tablet Take 1 tablet by mouth every 4 (four) hours as needed. Patient not taking: Reported on 10/13/2023 12/01/17   Latanya Poisson, MD  phenazopyridine  (PYRIDIUM ) 200  MG tablet Take 1 tablet (200 mg total) by mouth 3 (three) times daily as needed for up to 6 doses. 11/25/23   Thelbert Finner, MD  tamsulosin  (FLOMAX ) 0.4 MG CAPS capsule Take 1 capsule (0.4 mg total) by mouth daily after supper. 11/25/23   Thelbert Finner, MD      Allergies    Other, Iodine, and Shellfish allergy    Review of Systems   Review of Systems  Genitourinary:  Positive for hematuria.  All other systems reviewed and are negative.   Physical Exam Updated Vital  Signs BP (!) 139/96 (BP Location: Left Arm)   Pulse 71   Temp 97.7 F (36.5 C) (Oral)   Resp 20   Ht 5\' 11"  (1.803 m)   Wt 136.1 kg   SpO2 96%   BMI 41.84 kg/m  Physical Exam Vitals and nursing note reviewed. Exam conducted with a chaperone present.  Constitutional:      Appearance: Normal appearance.  HENT:     Head: Normocephalic and atraumatic.  Eyes:     Conjunctiva/sclera: Conjunctivae normal.  Pulmonary:     Effort: Pulmonary effort is normal. No respiratory distress.  Genitourinary:    Comments: Foley catheter in place, urine leaking around catheter, urine in tube clear and yellow, urine in bag dark red/brown with clots Skin:    General: Skin is warm and dry.  Neurological:     Mental Status: He is alert.  Psychiatric:        Mood and Affect: Mood normal.        Behavior: Behavior normal.     ED Results / Procedures / Treatments   Labs (all labs ordered are listed, but only abnormal results are displayed) Labs Reviewed  URINALYSIS, W/ REFLEX TO CULTURE (INFECTION SUSPECTED) - Abnormal; Notable for the following components:      Result Value   APPearance HAZY (*)    Hgb urine dipstick LARGE (*)    Protein, ur 100 (*)    Leukocytes,Ua MODERATE (*)    All other components within normal limits    EKG None  Radiology DG Abdomen 1 View Result Date: 11/26/2023 CLINICAL DATA:  Evaluate ureteral stent EXAM: ABDOMEN - 1 VIEW COMPARISON:  Intraoperative films from the previous day. FINDINGS: Previously placed right ureteral stent has migrated inferiorly with the proximal loop seen in the mid to distal ureter and likely extension of the ureter into the penile urethra although not included on this exam. No obstructive changes are seen. No free air is noted. Extensive lumbar degenerative changes noted. IMPRESSION: Right ureteral stent has migrated distally and likely extends into the penile urethra although not evaluated on this image. Electronically Signed   By: Violeta Grey M.D.   On: 11/26/2023 10:30   DG C-Arm 1-60 Min-No Report Result Date: 11/25/2023 Fluoroscopy was utilized by the requesting physician.  No radiographic interpretation.    Procedures Procedures    Medications Ordered in ED Medications  doxycycline  (VIBRA -TABS) tablet 100 mg (100 mg Oral Given 11/26/23 1129)    ED Course/ Medical Decision Making/ A&P                                 Medical Decision Making  This patient is a 83 y.o. male  who presents to the ED for concern of hematuria/foley catheter issue.   Past Medical History / Co-morbidities / Social History: afib on eliquis, CAD, diabetes,  HTN  Additional history: Chart reviewed. Pertinent results include: Reviewed operative note from yesterday with Dr Cathi Cluster, pt underwent cystoscopy / ureteroscopy / holmium laser / stent placement  Physical Exam: Physical exam performed. The pertinent findings include: Mildly hypertensive, otherwise normal vitals. Foley catheter in place, draining urine around the tube. Urine in foley bag dark red/brown.   Lab Tests/Imaging studies: I personally interpreted labs/imaging and the pertinent results include:  UA with large hemoglobin, moderate leukocytes.   KUB ordered after catheter exchange, shows right ureteral stent has migrated distally and likely extends into penile urethra. I agree with the radiologist interpretation.  Medications: I ordered medication including doxycycline  per urology request.  I have reviewed the patients home medicines and have made adjustments as needed.  Consultations obtained: I consulted with urologist Dr Cathi Cluster who advised catheter was not to be removed. Unfortunately this was discussed after catheter was exchanged per ER nursing staff at my order. Although nursing staff is adamant there was no additional equipment/stent attached to foley catheter when exchanged, there is now concern the stent was removed or displaced. Patient was supposed to have  this for the next 4 weeks.   Urology recommends KUB to evaluate stent location. KUB shows stent dislodged, likely in the urethra. Alla Ar NP with urology to see at bedside and remove stent.    Disposition: After consideration of the diagnostic results and the patients response to treatment, I feel that emergency department workup does not suggest an emergent condition requiring admission or immediate intervention beyond what has been performed at this time. The plan is: discharge to home. Foley catheter draining clear urine. Ureteral stent unfortunately had to be removed after dislodging, likely after catheter exchange. However, I do have some concern there may have been some movement of the stent prior to ER evaluation as patient developed gross hematuria overnight and there were no visualized stent strings on tape visualized externally on foley. However, stent inevitably removed after displacement. Patient started on antibiotics per urology (patient's insurance didn't cover the prescription sent by them yesterday) and patient given first dose in ER. Given very strict return precautions. The patient is safe for discharge and has been instructed to return immediately for worsening symptoms, change in symptoms or any other concerns.  Final Clinical Impression(s) / ED Diagnoses Final diagnoses:  Problem with Foley catheter, initial encounter (HCC)  Ureteral stent displacement, sequela    Rx / DC Orders ED Discharge Orders          Ordered    doxycycline  (VIBRAMYCIN ) 100 MG capsule  2 times daily        11/26/23 1054           Portions of this report may have been transcribed using voice recognition software. Every effort was made to ensure accuracy; however, inadvertent computerized transcription errors may be present.    Leva Rayas, PA-C 11/26/23 1149    Merdis Stalling, MD 11/26/23 1527

## 2023-11-26 NOTE — ED Notes (Signed)
 The provider asked me to irrigate the patient's catheter. Same was done with no increased pain or resistance. While irrigating, fluid began draining from around the catheter at the head of the penis. The provider was notified and asked that we change the catheter out to the next larger size.

## 2023-11-26 NOTE — Procedures (Signed)
   Urology Procedure Note:  Patient is an 83 year old male known to our practice having recently undergone right ureteroscopy and laser lithotripsy on 11/25/2023.  Overnight his catheter stopped draining and stent was displaced during exchange.  Please see separate consult note.  Case was briefly reviewed with patient and his son who remained in the room for the procedure.  After consent was obtained existing 13F Foley catheter was removed with passage of some bloody urine.  Next patient was prepped and draped in the usual sterile fashion.  Flexible cystoscope was advanced and I was quickly able to visualize the displaced ureteral stent in the distal urethra.  Unfortunately it continued to slide back towards the bladder with any contact.  I was eventually able to grasp and remove the stent in its entirely.  Next an 26F coud catheter was advanced to the level of the bladder with an immediate return of lightly blood-tinged irrigant.  Having confirmed placement, retention balloon was inflated with 10 cc of sterile water  and catheter was placed to gravity drainage without dependent loops.  StatLock was placed on the upper inner thigh without tension.  This concluded the procedure.  Alla Ar, NP [facility] Pager: [phone]

## 2023-12-01 DIAGNOSIS — E782 Mixed hyperlipidemia: Secondary | ICD-10-CM | POA: Diagnosis not present

## 2023-12-01 DIAGNOSIS — E1122 Type 2 diabetes mellitus with diabetic chronic kidney disease: Secondary | ICD-10-CM | POA: Diagnosis not present

## 2023-12-01 DIAGNOSIS — I482 Chronic atrial fibrillation, unspecified: Secondary | ICD-10-CM | POA: Diagnosis not present

## 2023-12-24 DIAGNOSIS — Z87442 Personal history of urinary calculi: Secondary | ICD-10-CM | POA: Diagnosis not present

## 2023-12-24 DIAGNOSIS — N281 Cyst of kidney, acquired: Secondary | ICD-10-CM | POA: Diagnosis not present

## 2023-12-29 DIAGNOSIS — Z87442 Personal history of urinary calculi: Secondary | ICD-10-CM | POA: Diagnosis not present

## 2023-12-29 DIAGNOSIS — N210 Calculus in bladder: Secondary | ICD-10-CM | POA: Diagnosis not present

## 2023-12-29 DIAGNOSIS — B3789 Other sites of candidiasis: Secondary | ICD-10-CM | POA: Diagnosis not present

## 2023-12-31 DIAGNOSIS — I482 Chronic atrial fibrillation, unspecified: Secondary | ICD-10-CM | POA: Diagnosis not present

## 2023-12-31 DIAGNOSIS — E782 Mixed hyperlipidemia: Secondary | ICD-10-CM | POA: Diagnosis not present

## 2023-12-31 DIAGNOSIS — E1122 Type 2 diabetes mellitus with diabetic chronic kidney disease: Secondary | ICD-10-CM | POA: Diagnosis not present

## 2024-01-14 DIAGNOSIS — E7849 Other hyperlipidemia: Secondary | ICD-10-CM | POA: Diagnosis not present

## 2024-01-14 DIAGNOSIS — E7800 Pure hypercholesterolemia, unspecified: Secondary | ICD-10-CM | POA: Diagnosis not present

## 2024-01-14 DIAGNOSIS — E876 Hypokalemia: Secondary | ICD-10-CM | POA: Diagnosis not present

## 2024-01-14 DIAGNOSIS — Z0001 Encounter for general adult medical examination with abnormal findings: Secondary | ICD-10-CM | POA: Diagnosis not present

## 2024-01-14 DIAGNOSIS — E1122 Type 2 diabetes mellitus with diabetic chronic kidney disease: Secondary | ICD-10-CM | POA: Diagnosis not present

## 2024-01-17 DIAGNOSIS — Z6841 Body Mass Index (BMI) 40.0 and over, adult: Secondary | ICD-10-CM | POA: Diagnosis not present

## 2024-01-17 DIAGNOSIS — E1122 Type 2 diabetes mellitus with diabetic chronic kidney disease: Secondary | ICD-10-CM | POA: Diagnosis not present

## 2024-01-17 DIAGNOSIS — E782 Mixed hyperlipidemia: Secondary | ICD-10-CM | POA: Diagnosis not present

## 2024-01-17 DIAGNOSIS — I482 Chronic atrial fibrillation, unspecified: Secondary | ICD-10-CM | POA: Diagnosis not present

## 2024-01-26 DIAGNOSIS — E1151 Type 2 diabetes mellitus with diabetic peripheral angiopathy without gangrene: Secondary | ICD-10-CM | POA: Diagnosis not present

## 2024-01-26 DIAGNOSIS — E1140 Type 2 diabetes mellitus with diabetic neuropathy, unspecified: Secondary | ICD-10-CM | POA: Diagnosis not present

## 2024-01-31 DIAGNOSIS — E782 Mixed hyperlipidemia: Secondary | ICD-10-CM | POA: Diagnosis not present

## 2024-01-31 DIAGNOSIS — E1122 Type 2 diabetes mellitus with diabetic chronic kidney disease: Secondary | ICD-10-CM | POA: Diagnosis not present

## 2024-01-31 DIAGNOSIS — I482 Chronic atrial fibrillation, unspecified: Secondary | ICD-10-CM | POA: Diagnosis not present

## 2024-03-02 DIAGNOSIS — E782 Mixed hyperlipidemia: Secondary | ICD-10-CM | POA: Diagnosis not present

## 2024-03-02 DIAGNOSIS — I482 Chronic atrial fibrillation, unspecified: Secondary | ICD-10-CM | POA: Diagnosis not present

## 2024-03-02 DIAGNOSIS — E1122 Type 2 diabetes mellitus with diabetic chronic kidney disease: Secondary | ICD-10-CM | POA: Diagnosis not present

## 2024-03-15 DIAGNOSIS — I420 Dilated cardiomyopathy: Secondary | ICD-10-CM | POA: Diagnosis not present

## 2024-03-15 DIAGNOSIS — I10 Essential (primary) hypertension: Secondary | ICD-10-CM | POA: Diagnosis not present

## 2024-03-15 DIAGNOSIS — I4821 Permanent atrial fibrillation: Secondary | ICD-10-CM | POA: Diagnosis not present

## 2024-03-15 DIAGNOSIS — I2510 Atherosclerotic heart disease of native coronary artery without angina pectoris: Secondary | ICD-10-CM | POA: Diagnosis not present

## 2024-03-22 DIAGNOSIS — Z87442 Personal history of urinary calculi: Secondary | ICD-10-CM | POA: Diagnosis not present

## 2024-03-22 DIAGNOSIS — N210 Calculus in bladder: Secondary | ICD-10-CM | POA: Diagnosis not present

## 2024-03-22 DIAGNOSIS — R338 Other retention of urine: Secondary | ICD-10-CM | POA: Diagnosis not present

## 2024-03-31 DIAGNOSIS — I482 Chronic atrial fibrillation, unspecified: Secondary | ICD-10-CM | POA: Diagnosis not present

## 2024-03-31 DIAGNOSIS — E1122 Type 2 diabetes mellitus with diabetic chronic kidney disease: Secondary | ICD-10-CM | POA: Diagnosis not present

## 2024-03-31 DIAGNOSIS — E782 Mixed hyperlipidemia: Secondary | ICD-10-CM | POA: Diagnosis not present

## 2024-04-10 DIAGNOSIS — E1151 Type 2 diabetes mellitus with diabetic peripheral angiopathy without gangrene: Secondary | ICD-10-CM | POA: Diagnosis not present

## 2024-04-10 DIAGNOSIS — E1140 Type 2 diabetes mellitus with diabetic neuropathy, unspecified: Secondary | ICD-10-CM | POA: Diagnosis not present

## 2024-05-02 DIAGNOSIS — E1122 Type 2 diabetes mellitus with diabetic chronic kidney disease: Secondary | ICD-10-CM | POA: Diagnosis not present

## 2024-05-02 DIAGNOSIS — E782 Mixed hyperlipidemia: Secondary | ICD-10-CM | POA: Diagnosis not present

## 2024-05-02 DIAGNOSIS — I482 Chronic atrial fibrillation, unspecified: Secondary | ICD-10-CM | POA: Diagnosis not present

## 2024-05-10 DIAGNOSIS — E1122 Type 2 diabetes mellitus with diabetic chronic kidney disease: Secondary | ICD-10-CM | POA: Diagnosis not present

## 2024-05-10 DIAGNOSIS — E7849 Other hyperlipidemia: Secondary | ICD-10-CM | POA: Diagnosis not present

## 2024-05-15 DIAGNOSIS — I10 Essential (primary) hypertension: Secondary | ICD-10-CM | POA: Diagnosis not present

## 2024-05-15 DIAGNOSIS — I420 Dilated cardiomyopathy: Secondary | ICD-10-CM | POA: Diagnosis not present

## 2024-05-15 DIAGNOSIS — I2510 Atherosclerotic heart disease of native coronary artery without angina pectoris: Secondary | ICD-10-CM | POA: Diagnosis not present

## 2024-05-15 DIAGNOSIS — I4821 Permanent atrial fibrillation: Secondary | ICD-10-CM | POA: Diagnosis not present

## 2024-05-17 DIAGNOSIS — Z6841 Body Mass Index (BMI) 40.0 and over, adult: Secondary | ICD-10-CM | POA: Diagnosis not present

## 2024-05-17 DIAGNOSIS — E782 Mixed hyperlipidemia: Secondary | ICD-10-CM | POA: Diagnosis not present

## 2024-05-17 DIAGNOSIS — E1122 Type 2 diabetes mellitus with diabetic chronic kidney disease: Secondary | ICD-10-CM | POA: Diagnosis not present

## 2024-05-17 DIAGNOSIS — I482 Chronic atrial fibrillation, unspecified: Secondary | ICD-10-CM | POA: Diagnosis not present

## 2024-05-17 DIAGNOSIS — I10 Essential (primary) hypertension: Secondary | ICD-10-CM | POA: Diagnosis not present

## 2024-05-18 DIAGNOSIS — C4442 Squamous cell carcinoma of skin of scalp and neck: Secondary | ICD-10-CM | POA: Diagnosis not present

## 2024-06-02 DIAGNOSIS — E1122 Type 2 diabetes mellitus with diabetic chronic kidney disease: Secondary | ICD-10-CM | POA: Diagnosis not present

## 2024-06-02 DIAGNOSIS — E782 Mixed hyperlipidemia: Secondary | ICD-10-CM | POA: Diagnosis not present

## 2024-06-02 DIAGNOSIS — I482 Chronic atrial fibrillation, unspecified: Secondary | ICD-10-CM | POA: Diagnosis not present

## 2024-06-16 DIAGNOSIS — H2513 Age-related nuclear cataract, bilateral: Secondary | ICD-10-CM | POA: Diagnosis not present

## 2024-06-19 DIAGNOSIS — E1151 Type 2 diabetes mellitus with diabetic peripheral angiopathy without gangrene: Secondary | ICD-10-CM | POA: Diagnosis not present

## 2024-06-19 DIAGNOSIS — E1140 Type 2 diabetes mellitus with diabetic neuropathy, unspecified: Secondary | ICD-10-CM | POA: Diagnosis not present

## 2024-06-26 DIAGNOSIS — Z87442 Personal history of urinary calculi: Secondary | ICD-10-CM | POA: Diagnosis not present

## 2024-06-30 DIAGNOSIS — E782 Mixed hyperlipidemia: Secondary | ICD-10-CM | POA: Diagnosis not present

## 2024-06-30 DIAGNOSIS — E1122 Type 2 diabetes mellitus with diabetic chronic kidney disease: Secondary | ICD-10-CM | POA: Diagnosis not present

## 2024-06-30 DIAGNOSIS — I482 Chronic atrial fibrillation, unspecified: Secondary | ICD-10-CM | POA: Diagnosis not present

## 2024-07-03 DIAGNOSIS — N209 Urinary calculus, unspecified: Secondary | ICD-10-CM | POA: Diagnosis not present

## 2024-07-05 DIAGNOSIS — I4821 Permanent atrial fibrillation: Secondary | ICD-10-CM | POA: Diagnosis not present
